# Patient Record
Sex: Male | Born: 1955
Health system: Southern US, Community
[De-identification: ages and names within clinical notes are randomized; demographics above are authoritative.]

## PROBLEM LIST (undated history)

## (undated) DIAGNOSIS — I10 Essential (primary) hypertension: Secondary | ICD-10-CM

## (undated) DIAGNOSIS — M1990 Unspecified osteoarthritis, unspecified site: Secondary | ICD-10-CM

## (undated) DIAGNOSIS — R0600 Dyspnea, unspecified: Secondary | ICD-10-CM

## (undated) DIAGNOSIS — E7800 Pure hypercholesterolemia, unspecified: Secondary | ICD-10-CM

## (undated) DIAGNOSIS — E669 Obesity, unspecified: Secondary | ICD-10-CM

## (undated) DIAGNOSIS — E119 Type 2 diabetes mellitus without complications: Secondary | ICD-10-CM

## (undated) DIAGNOSIS — Z87891 Personal history of nicotine dependence: Secondary | ICD-10-CM

## (undated) DIAGNOSIS — I4891 Unspecified atrial fibrillation: Secondary | ICD-10-CM

## (undated) DIAGNOSIS — R7303 Prediabetes: Secondary | ICD-10-CM

## (undated) HISTORY — DX: Personal history of nicotine dependence: Z87.891

## (undated) HISTORY — DX: Type 2 diabetes mellitus without complications: E11.9

## (undated) HISTORY — DX: Unspecified atrial fibrillation: I48.91

## (undated) HISTORY — PX: UMBILICAL HERNIA REPAIR: SHX196

## (undated) HISTORY — DX: Unspecified osteoarthritis, unspecified site: M19.90

## (undated) HISTORY — PX: TONSILLECTOMY: SUR1361

## (undated) HISTORY — DX: Obesity, unspecified: E66.9

---

## 2003-09-02 ENCOUNTER — Emergency Department (HOSPITAL_COMMUNITY): Admission: EM | Admit: 2003-09-02 | Discharge: 2003-09-03 | Payer: Self-pay | Admitting: Emergency Medicine

## 2003-11-04 ENCOUNTER — Emergency Department (HOSPITAL_COMMUNITY): Admission: EM | Admit: 2003-11-04 | Discharge: 2003-11-04 | Payer: Self-pay | Admitting: Emergency Medicine

## 2004-06-24 ENCOUNTER — Observation Stay (HOSPITAL_COMMUNITY): Admission: RE | Admit: 2004-06-24 | Discharge: 2004-06-25 | Payer: Self-pay | Admitting: Orthopedic Surgery

## 2004-06-24 ENCOUNTER — Encounter (INDEPENDENT_AMBULATORY_CARE_PROVIDER_SITE_OTHER): Payer: Self-pay | Admitting: Specialist

## 2004-06-24 HISTORY — PX: BACK SURGERY: SHX140

## 2008-05-06 ENCOUNTER — Emergency Department (HOSPITAL_COMMUNITY): Admission: EM | Admit: 2008-05-06 | Discharge: 2008-05-06 | Payer: Self-pay | Admitting: Emergency Medicine

## 2010-06-07 ENCOUNTER — Emergency Department (HOSPITAL_COMMUNITY)
Admission: EM | Admit: 2010-06-07 | Discharge: 2010-06-07 | Payer: Self-pay | Source: Home / Self Care | Admitting: Emergency Medicine

## 2011-01-10 NOTE — Op Note (Signed)
NAMECASEN, PRYOR                ACCOUNT NO.:  [id number]   MEDICAL RECORD NO.:  [id number]          PATIENT TYPE:  AMB   LOCATION:  DAY                          FACILITY:  [facility]   PHYSICIAN:  Marlowe Kays, M.D.  DATE OF BIRTH:  June 12, 1956   DATE OF PROCEDURE:  06/24/2004  DATE OF DISCHARGE:                                 OPERATIVE REPORT   PREOPERATIVE DIAGNOSIS:  Herniated nucleated pulposus L5-S1 left.   POSTOPERATIVE DIAGNOSIS:  Herniated nucleated pulposus L5-S1 left.   OPERATION:  Microdiskectomy, L5-S1, left.   SURGEON:  Dr. Fayrene Fearing [place]   ASSISTANT:  Dr. Worthy Rancher   ANESTHESIA:  General.   PATHOLOGY AND JUSTIFICATION FOR PROCEDURE:  Persistent back and left leg  pain central to the left disk herniation with pressure on the S1 nerve root  on MRI.   DESCRIPTION OF PROCEDURE:  Satisfactory general anesthesia, prophylactic  antibiotics, knee-chest position on the Cement frame.  Back was prepped  with DuraPrep and with three spinal needles and a lateral x-ray, I  tentatively localized the L5-S1 disk space, then continued draping the back  in a sterile field, Ioban employed, vertical midline incision based on the  initial x-ray.  I localized what I thought were the spinous processes of L5  and the sacrum, dissecting soft tissue off of both of them and then placing  a D'Errico retractor in the interlaminar space and taking a second lateral x-  ray, confirming that this retractor was headed right to the L5-S1 disk.  Accordingly, I inserted the self-retaining McCullough retractors and with a  3 mm Kerrison rongeur began removing bone from the inferior lamina of L5.  I  then undermined the sacrum with a small curette and used 2 and then later 3  mm Kerrison rongeurs to perform a wide foraminotomy removing bone,  ligamentum flavum working laterally.  When the decompression became more  difficult, brought in the microscope and completed the decompression.  His  S1  nerve root was extremely sensitive, and we had a little difficulty  mobilizing it initially.  There were a good bit of fibrovascular adhesions  lateral to it, many of which I was able to eradicate with the bipolar  cautery and 2 mm Kerrison rongeur.  After isolating the disk, I opened it  with a 15 knife blade and removed a large amount of disk material including  several large chunks centrally which is where the main portion of disk was  located with a combination of Epstein curette, nerve hook, straight, and  upbiting pituitaries.  Also looked beneath the nerve root to be sure that  there were no free fragments.  The foramen was widely patent at the  conclusion of the case, and the nerve was lying unencumbered.  There was no  unusual bleeding.  The wound was irrigated with sterile saline.  Gelfoam  soaked in thrombin was placed over the interspace, around the nerve and the  dura.  Self-retaining retractors were removed.  There was no unusual  bleeding.  Then closed the wound with interrupted #1 Vicryl in the fascia.  Subcutaneous  tissue, I infiltrated with 0.5% plain Marcaine and reapproximated with  interrupted #1 Vicryl as well.  Skin was closed with staples.  Betadine,  Adaptic dry sterile dressing were applied.  At the time of this dictation,  he was on his way to the recovery room in satisfactory condition with no  known complications.      JA/MEDQ  D:  06/24/2004  T:  06/24/2004  Job:  [id number]

## 2011-12-22 ENCOUNTER — Emergency Department (INDEPENDENT_AMBULATORY_CARE_PROVIDER_SITE_OTHER)
Admission: EM | Admit: 2011-12-22 | Discharge: 2011-12-22 | Disposition: A | Discharge status: Home / Self Care | Payer: Self-pay | Source: Home / Self Care | Attending: Family Medicine | Admitting: Family Medicine

## 2011-12-22 ENCOUNTER — Encounter (HOSPITAL_COMMUNITY): Payer: Self-pay

## 2011-12-22 DIAGNOSIS — M79605 Pain in left leg: Secondary | ICD-10-CM

## 2011-12-22 DIAGNOSIS — M545 Low back pain: Secondary | ICD-10-CM

## 2011-12-22 DIAGNOSIS — G8929 Other chronic pain: Secondary | ICD-10-CM

## 2011-12-22 HISTORY — DX: Essential (primary) hypertension: I10

## 2011-12-22 MED ORDER — NAPROXEN 500 MG PO TABS: 500.0000 mg | ORAL_TABLET | Freq: Two times a day (BID) | ORAL | Status: DC

## 2011-12-22 MED ORDER — ACETAMINOPHEN-CODEINE #3 300-30 MG PO TABS: 1.0000 | ORAL_TABLET | Freq: Four times a day (QID) | ORAL | Status: AC | PRN

## 2011-12-22 MED ORDER — PREDNISONE 20 MG PO TABS: ORAL_TABLET | ORAL | Status: AC

## 2011-12-22 NOTE — ED Notes (Signed)
C/o pain in lower back and throbbing and stinging pain in lt arm and leg.  States has chronic back pain due to work related injury for which he had back surgery in 2006.  States he has had intermittent issues with his back and pain/stinging in lt arm and leg since back injury.  States this episode has been going on for 1 week.

## 2011-12-22 NOTE — Discharge Instructions (Signed)
Take the prescribed medications as instructed. Be aware that Tylenol with codeine can cause drowsiness and he should not drive after taking this medication. Start back stretching exercises as soon as her pain improves as per handout. Go to your primary care provider or back specialist if persistent pain. Go to the emergency department if worsening of your symptoms or new symptoms like weakness in your arm, hand or leg, problems with balance, gait or vision, persistent headache etc.

## 2011-12-23 NOTE — ED Provider Notes (Signed)
History     CSN: [id number]  Arrival date & time 12/22/11  1642   First MD Initiated Contact with Patient 12/22/11 1657      Chief Complaint  Patient presents with  . Back Pain  . Arm Pain  . Leg Pain    (Consider location/radiation/quality/duration/timing/severity/associated sxs/prior treatment) HPI Comments: 56 y/o male with h/o chronic low back pain and left shoulder pain, here c/o flare up of low back and shoulder pain for 1 week. States he had an on the job injury for what he had a ruptured disk and had surgery in 2006. He also has had intermittent pain and "stinging" in left shoulder and thinks he developed joint disease related to his job, he is currently in the process of obtaining disability. Has not taken pain medications for his symptoms in last 2 days. Reports low back pain radiation to left leg similar as in prior events. Denies weakness or numbness of the left leg or left arm, but pain worse with movement. No saddle anesthesia or incontinence. No hematuria or dysuria. No headache, visual changes, gate or balance problems. No face droop of difficulty understanding or producing speech.    Past Medical History  Diagnosis Date  . Hypertension     Past Surgical History  Procedure Date  . Back surgery   . Tonsillectomy     No family history on file.  History  Substance Use Topics  . Smoking status: Current Everyday Smoker  . Smokeless tobacco: Not on file  . Alcohol Use: No      Review of Systems  Constitutional: Negative for fever and chills.  HENT: Negative for neck pain and neck stiffness.   Eyes: Negative for visual disturbance.  Cardiovascular: Negative for chest pain.  Genitourinary: Negative for dysuria, frequency, hematuria and flank pain.  Musculoskeletal: Positive for back pain and arthralgias.  Neurological: Negative for dizziness, seizures, facial asymmetry, speech difficulty, weakness, numbness and headaches.    Allergies  Review of patient's  allergies indicates no known allergies.  Home Medications   Current Outpatient Rx  Name Route Sig Dispense Refill  . ACETAMINOPHEN-CODEINE #3 300-30 MG PO TABS Oral Take 1-2 tablets by mouth every 6 (six) hours as needed for pain. 15 tablet 0  . NAPROXEN 500 MG PO TABS Oral Take 1 tablet (500 mg total) by mouth 2 (two) times daily. 20 tablet 0  . PREDNISONE 20 MG PO TABS  2 tabs po daily for 5 days 10 tablet 0    BP 122/66  Pulse 78  Temp(Src) 98.2 F (36.8 C) (Oral)  Resp 18  SpO2 98%  Physical Exam  Nursing note and vitals reviewed. Constitutional: He is oriented to person, place, and time. He appears well-developed and well-nourished. No distress.  HENT:  Head: Normocephalic and atraumatic.  Mouth/Throat: No oropharyngeal exudate.  Eyes: Conjunctivae and EOM are normal. Pupils are equal, round, and reactive to light. No scleral icterus.  Neck: Neck supple.       Negative Spurling test  Cardiovascular: Normal rate and regular rhythm.   Pulmonary/Chest: Breath sounds normal.  Musculoskeletal:       Left shoulder: no obvious deformity. No swelling or erythema. Fair range of motion although reported pain with abduction against resistance past 90 degrees. Mildly positive empty can test. Intact sensation. Left arm, forearm and hand impress neurovascularly intact.  Spine central: limited flexion and extension due to pain. Negative straight leg test. Left leg with intact sensation. DTRs symetric compared with right side.  Left lower extremity appears neurovascularly intact.  Neurological: He is alert and oriented to person, place, and time. He has normal reflexes. He displays no atrophy. No cranial nerve deficit or sensory deficit. He exhibits normal muscle tone. He displays a negative Romberg sign. Coordination normal.       No foot dragging, no arm drop. visual fields appear normal compared with mine.  Skin: No rash noted.    ED Course  Procedures (including critical care  time)  Labs Reviewed - No data to display No results found.   1. Low back pain radiating to left leg   2. Chronic shoulder pain, left       MDM  Treated with prednisone, tylenol #3 and naprosyn. Back exercises hand out provided asked to follow up with his spine specialist if persistent or worsening symptoms.         Sharin Grave, MD 12/23/11 1023

## 2012-07-21 ENCOUNTER — Encounter (HOSPITAL_COMMUNITY): Payer: Self-pay | Admitting: Emergency Medicine

## 2012-07-21 ENCOUNTER — Emergency Department (INDEPENDENT_AMBULATORY_CARE_PROVIDER_SITE_OTHER)
Admission: EM | Admit: 2012-07-21 | Discharge: 2012-07-21 | Disposition: A | Discharge status: Home / Self Care | Payer: Self-pay | Source: Home / Self Care | Attending: Emergency Medicine | Admitting: Emergency Medicine

## 2012-07-21 DIAGNOSIS — G8929 Other chronic pain: Secondary | ICD-10-CM

## 2012-07-21 DIAGNOSIS — M549 Dorsalgia, unspecified: Secondary | ICD-10-CM

## 2012-07-21 MED ORDER — MELOXICAM 7.5 MG PO TABS: 7.5000 mg | ORAL_TABLET | Freq: Every day | ORAL | Status: DC

## 2012-07-21 MED ORDER — CYCLOBENZAPRINE HCL 10 MG PO TABS: 10.0000 mg | ORAL_TABLET | Freq: Three times a day (TID) | ORAL | Status: DC | PRN

## 2012-07-21 NOTE — ED Notes (Signed)
Reports back pain since halloween 2006.  Patient feels if he stands up for a long period of times, his back hurts and he has numbness in right leg.  Reports lower back pain.  Tried OTC medication but no relief.  Patient feels if weather gets bad that's when he feels the pain the most

## 2012-07-21 NOTE — ED Provider Notes (Signed)
History     CSN: [id number]  Arrival date & time 07/21/12  1500   First MD Initiated Contact with Patient 07/21/12 1542      Chief Complaint  Patient presents with  . Back Pain    (Consider location/radiation/quality/duration/timing/severity/associated sxs/prior treatment) Patient is a 56 y.o. male presenting with back pain. The history is provided by the patient.  Back Pain  This is a recurrent problem. The current episode started more than 1 week ago. The problem occurs constantly. The problem has been gradually worsening. The pain is associated with falling and twisting. The quality of the pain is described as stabbing and aching. The pain is at a severity of 8/10. The pain is moderate. The symptoms are aggravated by certain positions and bending. Pertinent negatives include no fever, no numbness, no weight loss, no headaches, no abdominal pain, no abdominal swelling, no bowel incontinence, no perianal numbness, no bladder incontinence, no dysuria, no pelvic pain, no paresthesias, no paresis, no tingling and no weakness. He has tried nothing for the symptoms. The treatment provided no relief.    Past Medical History  Diagnosis Date  . Hypertension     Past Surgical History  Procedure Date  . Back surgery   . Tonsillectomy     History reviewed. No pertinent family history.  History  Substance Use Topics  . Smoking status: Current Every Day Smoker  . Smokeless tobacco: Not on file  . Alcohol Use: No      Review of Systems  Constitutional: Negative for fever, chills, weight loss, activity change and appetite change.  Gastrointestinal: Negative for abdominal pain and bowel incontinence.  Genitourinary: Negative for bladder incontinence, dysuria and pelvic pain.  Musculoskeletal: Positive for back pain. Negative for myalgias, joint swelling and arthralgias.  Neurological: Negative for dizziness, tingling, weakness, numbness, headaches and paresthesias.    Allergies    Review of patient's allergies indicates no known allergies.  Home Medications   Current Outpatient Rx  Name  Route  Sig  Dispense  Refill  . CYCLOBENZAPRINE HCL 10 MG PO TABS   Oral   Take 1 tablet (10 mg total) by mouth 3 (three) times daily as needed for muscle spasms.   20 tablet   0   . MELOXICAM 7.5 MG PO TABS   Oral   Take 1 tablet (7.5 mg total) by mouth daily. Take one tablet daily for 2 weeks   14 tablet   0     BP 104/65  Pulse 71  Temp 98.3 F (36.8 C) (Oral)  Resp 17  SpO2 98%  Physical Exam  Nursing note and vitals reviewed. Constitutional: He appears well-developed and well-nourished.  Eyes: Conjunctivae normal are normal.  Neck: Neck supple. No JVD present.  Cardiovascular:  No murmur heard. Musculoskeletal: He exhibits tenderness.       Lumbar back: He exhibits tenderness, bony tenderness and pain. He exhibits normal range of motion, no swelling, no edema and no spasm.       Back:  Neurological: He is alert.  Skin: No rash noted. No erythema.    ED Course  Procedures (including critical care time)  Labs Reviewed - No data to display No results found.   1. Chronic back pain       MDM   Exacerbation of chronic back pain. Patient refused a Toradol IM injection. Will send home with meloxicam and a muscle relaxer. Have advised patient to followup with orthopedic doctor pain was to persist beyond 2 weeks.  Jimmie Molly, MD 07/21/12 [id number]

## 2012-11-16 ENCOUNTER — Emergency Department (HOSPITAL_COMMUNITY)
Admission: EM | Admit: 2012-11-16 | Discharge: 2012-11-16 | Disposition: A | Discharge status: Home / Self Care | Payer: Self-pay | Source: Home / Self Care

## 2012-11-16 DIAGNOSIS — R358 Other polyuria: Secondary | ICD-10-CM

## 2012-11-16 DIAGNOSIS — H538 Other visual disturbances: Secondary | ICD-10-CM

## 2012-11-16 DIAGNOSIS — M545 Low back pain: Secondary | ICD-10-CM

## 2012-11-16 LAB — POCT URINALYSIS DIP (DEVICE)
Bilirubin Urine: NEGATIVE
Glucose, UA: NEGATIVE mg/dL
Hgb urine dipstick: NEGATIVE
Ketones, ur: NEGATIVE mg/dL
Leukocytes, UA: NEGATIVE
Nitrite: NEGATIVE
Protein, ur: NEGATIVE mg/dL
Specific Gravity, Urine: 1.02 (ref 1.005–1.030)
Urobilinogen, UA: 0.2 mg/dL (ref 0.0–1.0)
pH: 5.5 (ref 5.0–8.0)

## 2012-11-16 LAB — COMPREHENSIVE METABOLIC PANEL
ALT: 21 U/L (ref 0–53)
AST: 15 U/L (ref 0–37)
Albumin: 3.6 g/dL (ref 3.5–5.2)
Alkaline Phosphatase: 74 U/L (ref 39–117)
BUN: 14 mg/dL (ref 6–23)
CO2: 27 mEq/L (ref 19–32)
Calcium: 9.2 mg/dL (ref 8.4–10.5)
Chloride: 103 mEq/L (ref 96–112)
Creatinine, Ser: 0.98 mg/dL (ref 0.50–1.35)
GFR calc Af Amer: >90 mL/min (ref >90)
GFR calc non Af Amer: 89 mL/min — ABNORMAL LOW (ref >90)
Glucose, Bld: 96 mg/dL (ref 70–99)
Potassium: 4.5 mEq/L (ref 3.5–5.1)
Sodium: 138 mEq/L (ref 135–145)
Total Bilirubin: 0.3 mg/dL (ref 0.3–1.2)
Total Protein: 7 g/dL (ref 6.0–8.3)

## 2012-11-16 LAB — GLUCOSE, CAPILLARY: Glucose-Capillary: 85 mg/dL (ref 70–99)

## 2012-11-16 MED ORDER — NAPROXEN 500 MG PO TABS: ORAL_TABLET | ORAL | Status: DC

## 2012-11-16 MED ORDER — HYDROCODONE-ACETAMINOPHEN 5-325 MG PO TABS: 1.0000 | ORAL_TABLET | Freq: Three times a day (TID) | ORAL | Status: DC | PRN

## 2012-11-16 MED ORDER — CYCLOBENZAPRINE HCL 5 MG PO TABS: 5.0000 mg | ORAL_TABLET | Freq: Every evening | ORAL | Status: DC | PRN

## 2012-11-16 NOTE — ED Provider Notes (Signed)
History   CSN: [id number]  Arrival date & time 11/16/12  1658   Chief Complaint  Patient presents with  . Follow-up    HPI Pt reports that he has an exacerbation of chronic low back pain.  Pt says that he is having low back pain that radiates into the legs.  He is having pain with palpation.  He is reporting that he had a ruptured lower disc that required operation in 2006.  He says that he was given flexeril but it has been causing him to be very sleepy.  Pt says that he is urinating all the time.  He is having pain mostly at night.  No loss of bowel or bladder function.    Past Medical History  Diagnosis Date  . Hypertension     Past Surgical History  Procedure Laterality Date  . Back surgery    . Tonsillectomy      No family history on file.  History  Substance Use Topics  . Smoking status: Current Every Day Smoker  . Smokeless tobacco: Not on file  . Alcohol Use: No    Review of Systems  Genitourinary: Positive for urgency and frequency. Negative for enuresis.  Musculoskeletal: Positive for back pain.  All other systems reviewed and are negative.    Allergies  Review of patient's allergies indicates no known allergies.  Home Medications   Current Outpatient Rx  Name  Route  Sig  Dispense  Refill  . meloxicam (MOBIC) 7.5 MG tablet   Oral   Take 1 tablet (7.5 mg total) by mouth daily. Take one tablet daily for 2 weeks   14 tablet   0   . cyclobenzaprine (FLEXERIL) 10 MG tablet   Oral   Take 1 tablet (10 mg total) by mouth 3 (three) times daily as needed for muscle spasms.   20 tablet   0     BP 119/73  Pulse 60  Temp(Src) 97.8 F (36.6 C) (Oral)  Physical Exam  Nursing note and vitals reviewed. Constitutional: He is oriented to person, place, and time. He appears well-developed and well-nourished. No distress.  HENT:  Head: Normocephalic and atraumatic.  Eyes: Conjunctivae are normal. Pupils are equal, round, and reactive to light.  Neck:  Normal range of motion. Neck supple.  Cardiovascular: Normal rate and regular rhythm.   No murmur heard. Pulmonary/Chest: Effort normal and breath sounds normal.  Abdominal: Bowel sounds are normal.  Musculoskeletal: Normal range of motion.       Lumbar back: He exhibits tenderness, pain and spasm. He exhibits no bony tenderness, no swelling and no edema.  Neurological: He is alert and oriented to person, place, and time.  Skin: Skin is warm and dry. No rash noted. No erythema. No pallor.  Psychiatric: He has a normal mood and affect. His behavior is normal. Judgment and thought content normal.    ED Course  Procedures (including critical care time)  Labs Reviewed - No data to display No results found.  No diagnosis found.  MDM  IMPRESSION  Polyuria  Chronic LBP with acute exacerbation  Lumbar sprain   RECOMMENDATIONS / PLAN Rx given for cyclobenzaprine 5 mg po QHS prn spasm Hydrocodone/APAP 5 mg prn severe pain Naproxen 500 mg po every 12 hours with food prn Back injury prevention and exercises discussed and written information provided Check labs   FOLLOW UP 2 weeks for CPE  The patient was given clear instructions to go to ER or return to medical center  if symptoms don't improve, worsen or new problems develop.  The patient verbalized understanding.  The patient was told to call to get lab results if they haven't heard anything in the next week.    Results for orders placed during the hospital encounter of 11/16/12  COMPREHENSIVE METABOLIC PANEL      Result Value Range   Sodium 138  135 - 145 mEq/L   Potassium 4.5  3.5 - 5.1 mEq/L   Chloride 103  96 - 112 mEq/L   CO2 27  19 - 32 mEq/L   Glucose, Bld 96  70 - 99 mg/dL   BUN 14  6 - 23 mg/dL   Creatinine, Ser 4.54  0.50 - 1.35 mg/dL   Calcium 9.2  8.4 - 09.8 mg/dL   Total Protein 7.0  6.0 - 8.3 g/dL   Albumin 3.6  3.5 - 5.2 g/dL   AST 15  0 - 37 U/L   ALT 21  0 - 53 U/L   Alkaline Phosphatase 74  39 - 117 U/L    Total Bilirubin 0.3  0.3 - 1.2 mg/dL   GFR calc non Af Amer 89 (*) >90 mL/min   GFR calc Af Amer >90  >90 mL/min  HEMOGLOBIN A1C      Result Value Range   Hemoglobin A1C 6.0 (*) <5.7 %   Mean Plasma Glucose 126 (*) <117 mg/dL  GLUCOSE, CAPILLARY      Result Value Range   Glucose-Capillary 85  70 - 99 mg/dL  POCT URINALYSIS DIP (DEVICE)      Result Value Range   Glucose, UA NEGATIVE  NEGATIVE mg/dL   Bilirubin Urine NEGATIVE  NEGATIVE   Ketones, ur NEGATIVE  NEGATIVE mg/dL   Specific Gravity, Urine 1.020  1.005 - 1.030   Hgb urine dipstick NEGATIVE  NEGATIVE   pH 5.5  5.0 - 8.0   Protein, ur NEGATIVE  NEGATIVE mg/dL   Urobilinogen, UA 0.2  0.0 - 1.0 mg/dL   Nitrite NEGATIVE  NEGATIVE   Leukocytes, UA NEGATIVE  NEGATIVE           Tristan Proto Cyndie Mull, MD 11/17/12 [id number]

## 2012-11-16 NOTE — ED Notes (Signed)
Pt here for f/u after being seen at urgent care last Nov ember.  He c/o of back pain and urinary frequency, excessive thirst, blurred vision.  Feet swelling up and c/o numbness.

## 2012-11-17 ENCOUNTER — Telehealth (HOSPITAL_COMMUNITY): Payer: Self-pay

## 2012-11-17 DIAGNOSIS — M545 Low back pain: Secondary | ICD-10-CM | POA: Insufficient documentation

## 2012-11-17 LAB — HEMOGLOBIN A1C
Hgb A1c MFr Bld: 6 % — ABNORMAL HIGH (ref <5.7)
Mean Plasma Glucose: 126 mg/dL — ABNORMAL HIGH (ref <117)

## 2012-11-17 NOTE — Progress Notes (Signed)
Quick Note:  Please inform patient that he has prediabetes. Please mail him information on diabetes diet and physical activity. Low sugar diet recommended. Exercise 5 x per week. Return for complete physical in 1-2 months.   Rodney Langton, MD, CDE, FAAFP [facility] [place], [place]   ______

## 2012-11-17 NOTE — ED Notes (Signed)
Spoke with Justin [name] in reference to his recent labs. He understood he needs to start a low sugar diet and begin exercising and to return to office in 1-2 months for a physical

## 2012-12-13 ENCOUNTER — Emergency Department (INDEPENDENT_AMBULATORY_CARE_PROVIDER_SITE_OTHER)
Admission: EM | Admit: 2012-12-13 | Discharge: 2012-12-13 | Disposition: A | Discharge status: Home Health | Payer: No Typology Code available for payment source | Source: Home / Self Care

## 2012-12-13 ENCOUNTER — Encounter (HOSPITAL_COMMUNITY): Payer: Self-pay | Admitting: *Deleted

## 2012-12-13 DIAGNOSIS — Z09 Encounter for follow-up examination after completed treatment for conditions other than malignant neoplasm: Secondary | ICD-10-CM

## 2012-12-13 DIAGNOSIS — M545 Low back pain: Secondary | ICD-10-CM

## 2012-12-13 NOTE — ED Provider Notes (Signed)
History     CSN: [id number]  Arrival date & time 12/13/12  1018   First MD Initiated Contact with Patient 12/13/12 1058      Chief Complaint  Patient presents with  . Follow-up    (Consider location/radiation/quality/duration/timing/severity/associated sxs/prior treatment) HPI  Past Medical History  Diagnosis Date  . Hypertension     Past Surgical History  Procedure Laterality Date  . Back surgery    . Tonsillectomy      No family history on file.  History  Substance Use Topics  . Smoking status: Current Every Day Smoker  . Smokeless tobacco: Not on file  . Alcohol Use: No      Review of Systems  Allergies  Review of patient's allergies indicates no known allergies.  Home Medications   Current Outpatient Rx  Name  Route  Sig  Dispense  Refill  . cyclobenzaprine (FLEXERIL) 5 MG tablet   Oral   Take 1 tablet (5 mg total) by mouth at bedtime as needed for muscle spasms.   20 tablet   0   . HYDROcodone-acetaminophen (NORCO/VICODIN) 5-325 MG per tablet   Oral   Take 1 tablet by mouth every 8 (eight) hours as needed for pain.   20 tablet   0   . naproxen (NAPROSYN) 500 MG tablet      Take 1 po every 12 hours with food prn pain   20 tablet   0     BP 108/79  Pulse 62  Temp(Src) 98.1 F (36.7 C) (Oral)  Resp 18  SpO2 99%  Physical Exam Middle aged male in no acute distress HEENT: No pallor, no stool the mucosa Chest: Clear to auscultation bilaterally CVS: Normal S1 and S2, no murmurs Abdomen: Soft, nontender, bowel sounds present Extremities: Tender on palpation over bilateral lower lumbar  paraspinal area CNS: AAO X 3  ED Course  Procedures (including critical care time)  Labs Reviewed - No data to display No results found.   1. Follow up    Low back pain Like in the setting of lumbar strain. Patient was seen a few weeks ago and prescribed with pain medications however has not picked them up yet. I have informed him to start  taking his medications and see for response and followup in the clinic in one month. If he has worsening of his pain with or without associated weakness of his extremities or bowel/admitted abnormalities he needs to go to the ED. Counseled on smoking cessation and exercise to lose weight. Back injury prevention and exercises discussed again.  Prediabetes William A1c of 6. Patient informs having polyuria. Informs getting educational material on diet regimen and exercises to prevent diabetes on prior  visit. Reinforced again on dietary adherence and  Exercise.  Follow up in one month  MDM          Raynor Calcaterra, MD 12/13/12 1120

## 2012-12-13 NOTE — ED Notes (Signed)
Follow up with back pain.

## 2012-12-13 NOTE — ED Notes (Signed)
Patient presents for follow up; labs stated patient was pre-diabetic.

## 2013-01-27 ENCOUNTER — Ambulatory Visit: Payer: No Typology Code available for payment source | Attending: Family Medicine | Admitting: Family Medicine

## 2013-01-27 VITALS — BP 142/90 | HR 77 | Temp 97.7°F | Resp 16 | Wt 272.0 lb

## 2013-01-27 DIAGNOSIS — K089 Disorder of teeth and supporting structures, unspecified: Secondary | ICD-10-CM

## 2013-01-27 DIAGNOSIS — K0889 Other specified disorders of teeth and supporting structures: Secondary | ICD-10-CM

## 2013-01-27 MED ORDER — AMOXICILLIN 875 MG PO TABS: 875.0000 mg | ORAL_TABLET | Freq: Two times a day (BID) | ORAL | Status: DC

## 2013-01-27 NOTE — Progress Notes (Signed)
Patient here for dental abcess Needs dental referral

## 2013-01-27 NOTE — Progress Notes (Signed)
Subjective:     Patient ID: Justin [name], male   DOB: 1955-10-29, 57 y.o.   MRN: [id number]  HPI Pt here with several days of worsening dental pain, lower jaw on the left front. Has pain medication at home but needs to know who to call for dentist and wonders if he needs abx. No bony pain, fevers, or drainage.    Review of Systems per hpi     Objective:   Physical Exam  Nursing note and vitals reviewed. Constitutional: He appears well-developed and well-nourished.  HENT:  Very poor dentition, area of concern with red gums but no frank abscess  Cardiovascular: Normal rate.   Pulmonary/Chest: Effort normal.  Psychiatric: He has a normal mood and affect.       Assessment:     Pain, dental - Plan: amoxicillin (AMOXIL) 875 MG tablet       Plan:     Showed him on orange card where the number is to call Start on amox Unfortunately we do not have materials to do a dental block here so unable to do that F/u w dentist as soon as possible  Last seen 6 weeks ago and was supposed to f/u 4 weeks later but has not so will schedule that visit. rtc earlier if needed, call with questions or concerns.

## 2013-01-27 NOTE — Patient Instructions (Signed)
Dental Caries   Tooth decay (dental caries, cavities) is the most common of all oral diseases. It occurs in all ages but is more common in children and young adults.   CAUSES   Bacteria in your mouth combine with foods (particularly sugars and starches) to produce plaque. Plaque is a substance that sticks to the hard surfaces of teeth. The bacteria in the plaque produce acids that attack the enamel of teeth. Repeated acid attacks dissolve the enamel and create holes in the teeth. Root surfaces of teeth may also get these holes.   Other contributing factors include:    Frequent snacking and drinking of cavity-producing foods and liquids.   Poor oral hygiene.   Dry mouth.   Substance abuse such as methamphetamine.   Broken or poor fitting dental restorations.   Eating disorders.   Gastroesophageal reflux disease (GERD).   Certain radiation treatments to the head and neck.  SYMPTOMS   At first, dental decay appears as white, chalky areas on the enamel. In this early stage, symptoms are seldom present. As the decay progresses, pits and holes may appear on the enamel surfaces. Progression of the decay will lead to softening of the hard layers of the tooth. At this point you may experience some pain or achy feeling after sweet, hot, or cold foods or drinks are consumed. If left untreated, the decay will reach the internal structures of the tooth and produce severe pain. Extensive dental treatment, such as root canal therapy, may be needed to save the tooth at this late stage of decay development.   DIAGNOSIS   Most cavities will be detected during regular check-ups. A thorough medical and dental history will be taken by the dentist. The dentist will use instruments to check the surfaces of your teeth for any breakdown or discoloration. Some dentists have special instruments, such as lasers, that detect tooth decay. Dental X-rays may also show some cavities that are not visible to the eye (such as between the  contact areas of the teeth).  TREATMENT   Treatment involves removal of the tooth decay and replacement with a restorative material such as silver, gold, or composite (white) material. However, if the decay involves a large area of the tooth and there is little remaining healthy tooth structure, a cap (crown) will be fitted over the remaining structure. If the decay involves the center part of the tooth (pulp), root canal treatment will be needed before any type of dental restoration is placed. If the tooth is severely destroyed by the decay process, leaving the remaining tooth structures unrestorable, the tooth will need to be pulled (extracted). Some early tooth decay may be reversed by fluoride treatments and thorough brushing and flossing at home.  PREVENTION    Eat healthy foods. Restrict the amount of sugary, starchy foods and liquids you consume. Avoid frequent snacking and drinking of unhealthy foods and liquids.   Sealants can help with prevention of cavities. Sealants are composite resins applied onto the biting surfaces of teeth at risk for decay. They smooth out the pits and grooves and prevent food from being trapped in them. This is done in early childhood before tooth decay has started.   Fluoride tablets may also be prescribed to children between 6 months and 10 years of age if your drinking water is not fluoridated. The fluoride absorbed by the tooth enamel makes teeth less susceptible to decay. Thorough daily cleaning with a toothbrush and dental floss is the best way to prevent   cavities. Use of a fluoride toothpaste is highly recommended. Fluoride mouth rinses may be used in specific cases.   Topical application of fluoride by your dentist is important in children.   Regular visits with a dentist for checkups and cleanings are also important.  SEEK IMMEDIATE DENTAL CARE IF:   You have a fever.   You develop redness and swelling of your face, jaw, or neck.   You develop swelling around a  tooth.   You are unable to open your mouth or cannot swallow.   You have severe pain uncontrolled by pain medicine.  Document Released: 05/03/2002 Document Revised: 11/03/2011 Document Reviewed: 01/16/2011  [employer] Patient Information 2014 [employer].

## 2013-01-28 ENCOUNTER — Other Ambulatory Visit: Payer: Self-pay | Admitting: Family Medicine

## 2013-01-28 DIAGNOSIS — K0889 Other specified disorders of teeth and supporting structures: Secondary | ICD-10-CM

## 2013-01-28 MED ORDER — AMOXICILLIN 875 MG PO TABS: 875.0000 mg | ORAL_TABLET | Freq: Two times a day (BID) | ORAL | Status: DC

## 2013-01-28 NOTE — Progress Notes (Signed)
Received note this morning that pt was missing his amox script. Have printed another and will have it available for him to pick up here today. Please let him know.

## 2013-01-31 ENCOUNTER — Telehealth: Payer: Self-pay | Admitting: *Deleted

## 2013-01-31 NOTE — Telephone Encounter (Signed)
01/31/13 Patient not available message  Left for patient to return call.  P.Claramae Rigdon,RN

## 2013-01-31 NOTE — Progress Notes (Signed)
01/31/13 Patient picked up script on Friday. P.Ziah Leandro,RN BSN [facility]

## 2013-04-21 ENCOUNTER — Ambulatory Visit: Payer: No Typology Code available for payment source | Attending: Family Medicine | Admitting: Internal Medicine

## 2013-04-21 ENCOUNTER — Encounter: Payer: Self-pay | Admitting: Internal Medicine

## 2013-04-21 VITALS — BP 150/98 | HR 71 | Temp 98.5°F | Resp 18 | Ht 71.0 in | Wt 279.0 lb

## 2013-04-21 DIAGNOSIS — I10 Essential (primary) hypertension: Secondary | ICD-10-CM | POA: Insufficient documentation

## 2013-04-21 DIAGNOSIS — M545 Low back pain, unspecified: Secondary | ICD-10-CM | POA: Insufficient documentation

## 2013-04-21 DIAGNOSIS — M549 Dorsalgia, unspecified: Secondary | ICD-10-CM

## 2013-04-21 MED ORDER — OXYCODONE-ACETAMINOPHEN 5-325 MG PO TABS
1.0000 | ORAL_TABLET | ORAL | Status: DC | PRN
Start: 2013-04-21 — End: 2014-03-08

## 2013-04-21 MED ORDER — FUROSEMIDE 20 MG PO TABS: 10.0000 mg | ORAL_TABLET | Freq: Every day | ORAL | Status: DC

## 2013-04-21 NOTE — Patient Instructions (Signed)

## 2013-04-21 NOTE — Progress Notes (Signed)
Patient ID: Justin [name], male   DOB: November 14, 1955, 57 y.o.   MRN: [id number]  CC: Regular followup  HPI: Patient is 57 year old male who presents to clinic for regular followup. He explains he had back abscess drained several weeks ago and has completed a course of antibiotics. He reports this healed well, no new abscesses noted. He denies fevers and chills, no chest pain, no shortness of breath, no other recent sicknesses or hospitalizations. He reports compliance with medicines. He is not taking any medicines for blood pressure. He has been checking his blood pressure regularly and says that numbers are usually on the lower side.  No Known Allergies Past Medical History  Diagnosis Date  . Hypertension    Current Outpatient Prescriptions on File Prior to Visit  Medication Sig Dispense Refill  . cyclobenzaprine (FLEXERIL) 5 MG tablet Take 1 tablet (5 mg total) by mouth at bedtime as needed for muscle spasms.  20 tablet  0  . amoxicillin (AMOXIL) 875 MG tablet Take 1 tablet (875 mg total) by mouth 2 (two) times daily.  20 tablet  0  . naproxen (NAPROSYN) 500 MG tablet Take 1 po every 12 hours with food prn pain  20 tablet  0   No current facility-administered medications on file prior to visit.   Family history of diabetes and hypertension History   Social History  . Marital Status: Single    Spouse Name: N/A    Number of Children: N/A  . Years of Education: N/A   Occupational History  . Not on file.   Social History Main Topics  . Smoking status: Current Every Day Smoker  . Smokeless tobacco: Not on file  . Alcohol Use: No  . Drug Use: No  . Sexual Activity:    Other Topics Concern  . Not on file   Social History Narrative  . No narrative on file    Review of Systems  Constitutional: Negative for fever, chills, diaphoresis, activity change, appetite change and fatigue.  HENT: Negative for ear pain, nosebleeds, congestion, facial swelling, rhinorrhea, neck pain, neck  stiffness and ear discharge.   Eyes: Negative for pain, discharge, redness, itching and visual disturbance.  Respiratory: Negative for cough, choking, chest tightness, shortness of breath, wheezing and stridor.   Cardiovascular: Negative for chest pain, palpitations and leg swelling.  Gastrointestinal: Negative for abdominal distention.  Genitourinary: Negative for dysuria, urgency, frequency, hematuria, flank pain, decreased urine volume, difficulty urinating and dyspareunia.  Musculoskeletal: Negative for back pain, joint swelling, arthralgias and gait problem.  Neurological: Negative for dizziness, tremors, seizures, syncope, facial asymmetry, speech difficulty, weakness, light-headedness, numbness and headaches.  Hematological: Negative for adenopathy. Does not bruise/bleed easily.  Psychiatric/Behavioral: Negative for hallucinations, behavioral problems, confusion, dysphoric mood, decreased concentration and agitation.    Objective:   Filed Vitals:   04/21/13 1233  BP: 150/98  Pulse: 71  Temp: 98.5 F (36.9 C)  Resp: 18    Physical Exam  Constitutional: Appears well-developed and well-nourished. No distress.  CVS: RRR, S1/S2 +, no murmurs, no gallops, no carotid bruit.  Pulmonary: Effort and breath sounds normal, no stridor, rhonchi, wheezes, rales.  Abdominal: Soft. BS +,  no distension, tenderness, rebound or guarding.  Musculoskeletal: Normal range of motion. No edema and no tenderness.  Neuro: Alert. Normal reflexes, muscle tone coordination. No cranial nerve deficit. Psychiatric: Normal mood and affect. Behavior, judgment, thought content normal.   No results found for this basename: WBC, HGB, HCT, MCV, PLT   Lab  Results  Component Value Date   CREATININE 0.98 11/16/2012   BUN 14 11/16/2012   NA 138 11/16/2012   K 4.5 11/16/2012   CL 103 11/16/2012   CO2 27 11/16/2012    Lab Results  Component Value Date   HGBA1C 6.0* 11/16/2012   Lipid Panel  No results found for  this basename: chol, trig, hdl, cholhdl, vldl, ldlcalc       Assessment and plan:   Patient Active Problem List   Diagnosis Date Noted  . Low back pain - stable, controlled on current analgesia, referral to pain clinic made 11/17/2012  .  hypertension  - blood pressure above target range on today's visit but because patient is checking his blood pressure regularly and says that it's usually very well controlled we'll hold off on any antihypertensive medicines at this time. I advised patient to continue checking his blood pressure regularly and to call us back if the numbers are higher than 140/97 we can initiate medical therapy as indicated.  11/17/2012        Regular followup

## 2013-04-21 NOTE — Progress Notes (Signed)
PT HERE WITH SWELLING IN BOTH LEGS S/P CHRONIC BACK PAIN X 3 DYS. STATES THE SWELLING WORSENS WITH WALKING. DENIES SOB OR CP.BP 150/98 STATES PRESCRIBED MUSCLE RELAXER NOT WORKING

## 2013-05-11 ENCOUNTER — Encounter: Payer: Self-pay | Admitting: Physical Medicine & Rehabilitation

## 2013-05-23 ENCOUNTER — Ambulatory Visit: Payer: No Typology Code available for payment source | Attending: Internal Medicine

## 2013-05-23 VITALS — BP 128/78 | HR 74 | Temp 98.7°F | Resp 16 | Ht 71.0 in | Wt 281.0 lb

## 2013-05-23 DIAGNOSIS — Z299 Encounter for prophylactic measures, unspecified: Secondary | ICD-10-CM

## 2013-05-23 DIAGNOSIS — Z09 Encounter for follow-up examination after completed treatment for conditions other than malignant neoplasm: Secondary | ICD-10-CM | POA: Insufficient documentation

## 2013-05-23 LAB — BASIC METABOLIC PANEL
BUN: 14 mg/dL (ref 6–23)
CO2: 28 mEq/L (ref 19–32)
Calcium: 9.4 mg/dL (ref 8.4–10.5)
Chloride: 104 mEq/L (ref 96–112)
Creat: 1.02 mg/dL (ref 0.50–1.35)
Glucose, Bld: 146 mg/dL — ABNORMAL HIGH (ref 70–99)
Potassium: 4.6 mEq/L (ref 3.5–5.3)
Sodium: 139 mEq/L (ref 135–145)

## 2013-05-23 LAB — POCT GLYCOSYLATED HEMOGLOBIN (HGB A1C): Hemoglobin A1C: 6.2

## 2013-05-23 LAB — LIPID PANEL
Cholesterol: 237 mg/dL — ABNORMAL HIGH (ref 0–200)
HDL: 40 mg/dL (ref >39)
LDL Cholesterol: 140 mg/dL — ABNORMAL HIGH (ref 0–99)
Total CHOL/HDL Ratio: 5.9 Ratio
Triglycerides: 285 mg/dL — ABNORMAL HIGH (ref <150)
VLDL: 57 mg/dL — ABNORMAL HIGH (ref 0–40)

## 2013-05-23 NOTE — Progress Notes (Signed)
Pt is here today for labs only. 

## 2013-05-23 NOTE — Patient Instructions (Signed)
Pt was instructed to watch his diet. Eat right and exercise.

## 2013-05-30 ENCOUNTER — Telehealth: Payer: Self-pay | Admitting: Emergency Medicine

## 2013-05-30 NOTE — Telephone Encounter (Signed)
Message copied by Darlis Loan on Mon May 30, 2013  2:09 PM ------      Message from: Quentin Angst      Created: Mon May 30, 2013 11:14 AM       Please call to inform patient that his lipid panel shows high cholesterol level, his hemoglobin A1c also shows that he is at risk for diabetes. For now we'll attempt to control this with nutrition and exercise. Low carbohydrate diet, more protein diet, regular exercise at least 3 times a week minimum of 30 minutes each time. We will repeat this test again in 3 months, if lipid panel remains the same we'll need to start medication. ------

## 2013-05-30 NOTE — Telephone Encounter (Signed)
Left message with pt to call clinic to schedule repeat lipid panel

## 2013-05-31 ENCOUNTER — Telehealth: Payer: Self-pay | Admitting: Emergency Medicine

## 2013-05-31 NOTE — Telephone Encounter (Signed)
Pt called with lab results and scheduled appt for repeat lipid panel 08/29/13

## 2013-06-06 ENCOUNTER — Ambulatory Visit: Payer: No Typology Code available for payment source

## 2013-06-06 ENCOUNTER — Encounter: Payer: Self-pay | Admitting: Internal Medicine

## 2013-06-06 ENCOUNTER — Ambulatory Visit: Payer: No Typology Code available for payment source | Attending: Internal Medicine | Admitting: Internal Medicine

## 2013-06-06 VITALS — BP 126/73 | HR 70 | Temp 98.2°F | Resp 14 | Ht 71.0 in | Wt 278.0 lb

## 2013-06-06 DIAGNOSIS — E785 Hyperlipidemia, unspecified: Secondary | ICD-10-CM | POA: Insufficient documentation

## 2013-06-06 DIAGNOSIS — M545 Low back pain, unspecified: Secondary | ICD-10-CM | POA: Insufficient documentation

## 2013-06-06 DIAGNOSIS — I10 Essential (primary) hypertension: Secondary | ICD-10-CM | POA: Insufficient documentation

## 2013-06-06 DIAGNOSIS — E669 Obesity, unspecified: Secondary | ICD-10-CM | POA: Insufficient documentation

## 2013-06-06 MED ORDER — FUROSEMIDE 20 MG PO TABS: 10.0000 mg | ORAL_TABLET | Freq: Every day | ORAL | Status: DC

## 2013-06-06 MED ORDER — OMEGA-3-ACID ETHYL ESTERS 1 G PO CAPS: 2.0000 g | ORAL_CAPSULE | Freq: Two times a day (BID) | ORAL | Status: DC

## 2013-06-06 NOTE — Progress Notes (Signed)
CC: Follow up  HPI: 57 year old male with past medical history of low back pain who presented to clinic for followup. Patient has no current complaints. He needs refills on Lasix. No chest pain or shortness of breath. No reports of lower extremity swelling. No abdominal pain, nausea or vomiting.  No Known Allergies Past Medical History  Diagnosis Date  . Hypertension    Current Outpatient Prescriptions on File Prior to Visit  Medication Sig Dispense Refill  . oxyCODONE-acetaminophen (PERCOCET/ROXICET) 5-325 MG per tablet Take 1 tablet by mouth every 4 (four) hours as needed for pain.  65 tablet  0  . cyclobenzaprine (FLEXERIL) 5 MG tablet Take 1 tablet (5 mg total) by mouth at bedtime as needed for muscle spasms.  20 tablet  0  . naproxen (NAPROSYN) 500 MG tablet Take 1 po every 12 hours with food prn pain  20 tablet  0   No current facility-administered medications on file prior to visit.   Family medical history significant for HTN, HLD  History   Social History  . Marital Status: Single    Spouse Name: N/A    Number of Children: N/A  . Years of Education: N/A   Occupational History  . Not on file.   Social History Main Topics  . Smoking status: Current Every Day Smoker  . Smokeless tobacco: Not on file  . Alcohol Use: No  . Drug Use: No  . Sexual Activity:    Other Topics Concern  . Not on file   Social History Narrative  . No narrative on file    Review of Systems  Constitutional: Negative for fever, chills, diaphoresis, activity change, appetite change and fatigue.  HENT: Negative for ear pain, nosebleeds, congestion, facial swelling, rhinorrhea, neck pain, neck stiffness and ear discharge.   Eyes: Negative for pain, discharge, redness, itching and visual disturbance.  Respiratory: Negative for cough, choking, chest tightness, shortness of breath, wheezing and stridor.   Cardiovascular: Negative for chest pain, palpitations and leg swelling.   Gastrointestinal: Negative for abdominal distention.  Genitourinary: Negative for dysuria, urgency, frequency, hematuria, flank pain, decreased urine volume, difficulty urinating and dyspareunia.  Musculoskeletal: Negative for back pain, joint swelling, arthralgias and gait problem.  Neurological: Negative for dizziness, tremors, seizures, syncope, facial asymmetry, speech difficulty, weakness, light-headedness, numbness and headaches.  Hematological: Negative for adenopathy. Does not bruise/bleed easily.  Psychiatric/Behavioral: Negative for hallucinations, behavioral problems, confusion, dysphoric mood, decreased concentration and agitation.    Objective:   Filed Vitals:   06/06/13 1129  BP: 126/73  Pulse: 70  Temp: 98.2 F (36.8 C)  Resp: 14    Physical Exam  Constitutional: Appears well-developed and well-nourished. No distress.  HENT: Normocephalic. External right and left ear normal. Oropharynx is clear and moist.  Eyes: Conjunctivae and EOM are normal. PERRLA, no scleral icterus.  Neck: Normal ROM. Neck supple. No JVD. No tracheal deviation. No thyromegaly.  CVS: RRR, S1/S2 +, no murmurs, no gallops, no carotid bruit.  Pulmonary: Effort and breath sounds normal, no stridor, rhonchi, wheezes, rales.  Abdominal: Soft. BS +,  no distension, tenderness, rebound or guarding.  Musculoskeletal: Normal range of motion. No edema and no tenderness.  Lymphadenopathy: No lymphadenopathy noted, cervical, inguinal. Neuro: Alert. Normal reflexes, muscle tone coordination. No cranial nerve deficit. Skin: Skin is warm and dry. No rash noted. Not diaphoretic. No erythema. No pallor.  Psychiatric: Normal mood and affect. Behavior, judgment, thought content normal.   No results found for this basename: WBC, HGB, HCT,  MCV, PLT   Lab Results  Component Value Date   CREATININE 1.02 05/23/2013   BUN 14 05/23/2013   NA 139 05/23/2013   K 4.6 05/23/2013   CL 104 05/23/2013   CO2 28 05/23/2013     Lab Results  Component Value Date   HGBA1C 6.2 05/23/2013   Lipid Panel     Component Value Date/Time   CHOL 237* 05/23/2013 0907   TRIG 285* 05/23/2013 0907   HDL 40 05/23/2013 0907   CHOLHDL 5.9 05/23/2013 0907   VLDL 57* 05/23/2013 0907   LDLCALC 140* 05/23/2013 0907       Assessment and plan:   Patient Active Problem List   Diagnosis Date Noted  . Dyslipidemia 06/06/2013    Priority: Medium - Prescription provided for omega-3. We've talked about the nutrition regimen and how to decrease cholesterol by dietary and lifestyle modifications   . Low back pain 11/17/2012    Priority: Medium - Continue current pain medications. Referral provided to pain management clinic

## 2013-06-06 NOTE — Progress Notes (Signed)
Pt is here for a F/U visit. Pt has pain in his lower back and legs and the pain is radiating down his arms. Pt reports that for 4 days he has had numbness and tingling in his arms and hands.

## 2013-06-06 NOTE — Patient Instructions (Signed)

## 2013-08-29 ENCOUNTER — Ambulatory Visit: Payer: Medicare Other | Attending: Internal Medicine

## 2013-08-29 DIAGNOSIS — Z0000 Encounter for general adult medical examination without abnormal findings: Secondary | ICD-10-CM

## 2013-08-29 LAB — LIPID PANEL
Cholesterol: 201 mg/dL — ABNORMAL HIGH (ref 0–200)
HDL: 36 mg/dL — ABNORMAL LOW (ref >39)
LDL Cholesterol: 119 mg/dL — ABNORMAL HIGH (ref 0–99)
Total CHOL/HDL Ratio: 5.6 Ratio
Triglycerides: 230 mg/dL — ABNORMAL HIGH (ref <150)
VLDL: 46 mg/dL — ABNORMAL HIGH (ref 0–40)

## 2013-08-30 ENCOUNTER — Ambulatory Visit: Payer: Self-pay

## 2013-09-02 ENCOUNTER — Encounter: Payer: Self-pay | Admitting: Internal Medicine

## 2013-09-02 ENCOUNTER — Ambulatory Visit: Payer: Medicare Other | Attending: Internal Medicine | Admitting: Internal Medicine

## 2013-09-02 VITALS — BP 149/68 | HR 83 | Temp 98.7°F | Resp 14 | Ht 71.0 in | Wt 294.4 lb

## 2013-09-02 DIAGNOSIS — E119 Type 2 diabetes mellitus without complications: Secondary | ICD-10-CM

## 2013-09-02 DIAGNOSIS — M549 Dorsalgia, unspecified: Secondary | ICD-10-CM | POA: Diagnosis not present

## 2013-09-02 DIAGNOSIS — E785 Hyperlipidemia, unspecified: Secondary | ICD-10-CM | POA: Diagnosis not present

## 2013-09-02 LAB — GLUCOSE, POCT (MANUAL RESULT ENTRY): POC Glucose: 141 mg/dl — AB (ref 70–99)

## 2013-09-02 LAB — POCT GLYCOSYLATED HEMOGLOBIN (HGB A1C): Hemoglobin A1C: 6

## 2013-09-02 MED ORDER — NAPROXEN 500 MG PO TABS: ORAL_TABLET | ORAL | Status: DC

## 2013-09-02 MED ORDER — FUROSEMIDE 20 MG PO TABS: 10.0000 mg | ORAL_TABLET | Freq: Every day | ORAL | Status: DC

## 2013-09-02 MED ORDER — GABAPENTIN 100 MG PO CAPS: 200.0000 mg | ORAL_CAPSULE | Freq: Three times a day (TID) | ORAL | Status: DC

## 2013-09-02 MED ORDER — OMEGA-3-ACID ETHYL ESTERS 1 G PO CAPS: 2.0000 g | ORAL_CAPSULE | Freq: Two times a day (BID) | ORAL | Status: DC

## 2013-09-02 NOTE — Progress Notes (Signed)
Patient ID: Justin [name], male   DOB: 07-Sep-1955, 58 y.o.   MRN: [id number]   CC:  HPI:  58 year old male presents for a follow of his back pain. He has been taking Flexeril and Percocet for the pain. He states that the Flexeril makes him sleepy. He complains of numbness going down his right leg. He denies any stool or urinary incontinence. He has an appointment with Dr. Tessa Lerner at the pain clinic next week   Denies any chest pain any shortness of breath A1c today 6.0   No Known Allergies Past Medical History  Diagnosis Date  . Hypertension    Current Outpatient Prescriptions on File Prior to Visit  Medication Sig Dispense Refill  . oxyCODONE-acetaminophen (PERCOCET/ROXICET) 5-325 MG per tablet Take 1 tablet by mouth every 4 (four) hours as needed for pain.  65 tablet  0   No current facility-administered medications on file prior to visit.   No family history on file. History   Social History  . Marital Status: Single    Spouse Name: N/A    Number of Children: N/A  . Years of Education: N/A   Occupational History  . Not on file.   Social History Main Topics  . Smoking status: Current Every Day Smoker  . Smokeless tobacco: Not on file  . Alcohol Use: No  . Drug Use: No  . Sexual Activity:    Other Topics Concern  . Not on file   Social History Narrative  . No narrative on file    Review of Systems  Constitutional: As in history of present illness HENT: Negative for ear pain, nosebleeds, congestion, facial swelling, rhinorrhea, neck pain, neck stiffness and ear discharge.   Eyes: Negative for pain, discharge, redness, itching and visual disturbance.  Respiratory: Negative for cough, choking, chest tightness, shortness of breath, wheezing and stridor.   Cardiovascular: Negative for chest pain, palpitations and leg swelling.  Gastrointestinal: Negative for abdominal distention.  Genitourinary: Negative for dysuria, urgency, frequency, hematuria, flank pain,  decreased urine volume, difficulty urinating and dyspareunia.  Musculoskeletal: As in history of present illness  Neurological: Negative for dizziness, tremors, seizures, syncope, facial asymmetry, speech difficulty, weakness, light-headedness, numbness and headaches.  Hematological: Negative for adenopathy. Does not bruise/bleed easily.  Psychiatric/Behavioral: Negative for hallucinations, behavioral problems, confusion, dysphoric mood, decreased concentration and agitation.    Objective:   Filed Vitals:   09/02/13 0946  BP: 149/68  Pulse: 83  Temp: 98.7 F (37.1 C)  Resp: 14    Physical Exam  Constitutional: Appears well-developed and well-nourished. No distress.  HENT: Normocephalic. External right and left ear normal. Oropharynx is clear and moist.  Eyes: Conjunctivae and EOM are normal. PERRLA, no scleral icterus.  Neck: Normal ROM. Neck supple. No JVD. No tracheal deviation. No thyromegaly.  CVS: RRR, S1/S2 +, no murmurs, no gallops, no carotid bruit.  Pulmonary: Effort and breath sounds normal, no stridor, rhonchi, wheezes, rales.  Abdominal: Soft. BS +,  no distension, tenderness, rebound or guarding.  Musculoskeletal: Normal range of motion. No edema and no tenderness.  Lymphadenopathy: No lymphadenopathy noted, cervical, inguinal. Neuro: Alert. Normal reflexes, muscle tone coordination. No cranial nerve deficit. Skin: Skin is warm and dry. No rash noted. Not diaphoretic. No erythema. No pallor.  Psychiatric: Normal mood and affect. Behavior, judgment, thought content normal.   No results found for this basename: WBC, HGB, HCT, MCV, PLT   Lab Results  Component Value Date   CREATININE 1.02 05/23/2013   BUN 14  05/23/2013   NA 139 05/23/2013   K 4.6 05/23/2013   CL 104 05/23/2013   CO2 28 05/23/2013    Lab Results  Component Value Date   HGBA1C 6.0 09/02/2013   Lipid Panel     Component Value Date/Time   CHOL 201* 08/29/2013 1040   TRIG 230* 08/29/2013 1040   HDL 36*  08/29/2013 1040   CHOLHDL 5.6 08/29/2013 1040   VLDL 46* 08/29/2013 1040   LDLCALC 119* 08/29/2013 1040       Assessment and plan:   Patient Active Problem List   Diagnosis Date Noted  . Dyslipidemia 06/06/2013  . Obesity (BMI 30-39.9) 06/06/2013  . Low back pain 11/17/2012  . Lumbago 11/17/2012   Hyperlipidemia Continue Lovaza, lipid panel on the next appointment   Back pain which is chronic for 6 years He will see Dr. Tessa Lerner next week Discontinue Flexeril Started on gabapentin 200 mg 3 times a day Refill Naprosyn No refills for Percocet given, his last refill here was 04/21/13 for 65 tablets        The patient was given clear instructions to go to ER or return to medical center if symptoms don't improve, worsen or new problems develop. The patient verbalized understanding. The patient was told to call to get any lab results if not heard anything in the next week.

## 2013-09-02 NOTE — Progress Notes (Signed)
Pt is here for a f/u. Requests results for labs and medication refills.

## 2013-10-31 ENCOUNTER — Ambulatory Visit: Payer: Medicare Other | Admitting: Internal Medicine

## 2013-11-23 ENCOUNTER — Ambulatory Visit: Payer: Medicare Other | Admitting: Physical Medicine & Rehabilitation

## 2013-11-23 ENCOUNTER — Ambulatory Visit: Payer: No Typology Code available for payment source | Admitting: Physical Medicine & Rehabilitation

## 2013-11-29 ENCOUNTER — Ambulatory Visit: Payer: Medicare Other | Admitting: Internal Medicine

## 2014-01-10 ENCOUNTER — Encounter: Payer: Medicare Other | Admitting: Physical Medicine & Rehabilitation

## 2014-02-28 ENCOUNTER — Ambulatory Visit: Payer: Medicare Other | Admitting: Internal Medicine

## 2014-03-08 ENCOUNTER — Encounter: Payer: Self-pay | Admitting: Physical Medicine & Rehabilitation

## 2014-03-08 ENCOUNTER — Encounter: Payer: Medicare Other | Attending: Physical Medicine & Rehabilitation | Admitting: Physical Medicine & Rehabilitation

## 2014-03-08 VITALS — BP 108/65 | HR 71 | Resp 14 | Ht 71.0 in | Wt 260.0 lb

## 2014-03-08 DIAGNOSIS — Z981 Arthrodesis status: Secondary | ICD-10-CM | POA: Diagnosis not present

## 2014-03-08 DIAGNOSIS — M47817 Spondylosis without myelopathy or radiculopathy, lumbosacral region: Secondary | ICD-10-CM | POA: Diagnosis not present

## 2014-03-08 DIAGNOSIS — M961 Postlaminectomy syndrome, not elsewhere classified: Secondary | ICD-10-CM

## 2014-03-08 DIAGNOSIS — Z79899 Other long term (current) drug therapy: Secondary | ICD-10-CM | POA: Diagnosis not present

## 2014-03-08 DIAGNOSIS — M549 Dorsalgia, unspecified: Secondary | ICD-10-CM | POA: Diagnosis not present

## 2014-03-08 DIAGNOSIS — M545 Low back pain, unspecified: Secondary | ICD-10-CM | POA: Insufficient documentation

## 2014-03-08 DIAGNOSIS — IMO0002 Reserved for concepts with insufficient information to code with codable children: Secondary | ICD-10-CM | POA: Insufficient documentation

## 2014-03-08 DIAGNOSIS — Z5181 Encounter for therapeutic drug level monitoring: Secondary | ICD-10-CM | POA: Diagnosis not present

## 2014-03-08 DIAGNOSIS — I10 Essential (primary) hypertension: Secondary | ICD-10-CM | POA: Diagnosis not present

## 2014-03-08 DIAGNOSIS — G8929 Other chronic pain: Secondary | ICD-10-CM | POA: Diagnosis not present

## 2014-03-08 MED ORDER — METHOCARBAMOL 500 MG PO TABS
500.0000 mg | ORAL_TABLET | Freq: Four times a day (QID) | ORAL | Status: DC | PRN
Start: 2014-03-08 — End: 2016-12-26

## 2014-03-08 MED ORDER — GABAPENTIN 100 MG PO CAPS: 100.0000 mg | ORAL_CAPSULE | Freq: Three times a day (TID) | ORAL | Status: DC

## 2014-03-08 MED ORDER — NAPROXEN 500 MG PO TABS: ORAL_TABLET | ORAL | Status: DC

## 2014-03-08 NOTE — Patient Instructions (Signed)
PLEASE CALL ME WITH ANY PROBLEMS OR QUESTIONS (#[phone]).      

## 2014-03-08 NOTE — Progress Notes (Signed)
Subjective:    Patient ID: Justin [name], male    DOB: [birth year], 58 y.o.   MRN: [id number]  HPI  This is an initial visit for Justin [name] who complains of low back pain for over a year now. He has a history of lumbar diskectomy and fusion at? L5-S1 by Dr. Collier Salina apparently.  He states the pain might have come on after he moved last year. He typically gets low back pain if he stands or walks more than 5 or 10 minutes. Along with the pain his right leg goes numb. The left leg becomes numb on occasion. Sometimes his legs feel that they are about to give away when the symptoms come on. He has fallen on occasion. He usually uses a walker or cane or holds on to something if he feels this sensation.  Sometimes he has pain in his hands as well. He denies numbness in his hands. He reports that he has difficulties making a fist.   He's on nothing for pain at this point, except for an occasional naproxen. He may taken narcotics in the past, but he's not sure. He does admit to smoking marijuana recently. He does not drink    Pain Inventory Average Pain 5 Pain Right Now 5 My pain is burning and tingling  In the last 24 hours, has pain interfered with the following? General activity 7 Relation with others 6 Enjoyment of life 5 What TIME of day is your pain at its worst? evening Sleep (in general) Poor  Pain is worse with: walking, sitting and standing Pain improves with: medication Relief from Meds: 5  Mobility walk without assistance ability to climb steps?  yes do you drive?  yes  Function not employed: date last employed . Do you have any goals in this area?  no  Neuro/Psych bladder control problems weakness numbness tingling depression  Prior Studies x-rays  Physicians involved in your care Primary care .   History reviewed. No pertinent family history. History   Social History  . Marital Status: Single    Spouse Name: N/A    Number of Children: N/A  . Years  of Education: N/A   Social History Main Topics  . Smoking status: Current Every Day Smoker  . Smokeless tobacco: None  . Alcohol Use: No  . Drug Use: No  . Sexual Activity:    Other Topics Concern  . None   Social History Narrative  . None   Past Surgical History  Procedure Laterality Date  . Back surgery    . Tonsillectomy     Past Medical History  Diagnosis Date  . Hypertension    BP 108/65  Pulse 71  Resp 14  Ht 5\' 11"  (1.803 m)  Wt 260 lb (117.935 kg)  BMI 36.28 kg/m2  SpO2 99%  Opioid Risk Score: 11 Fall Risk Score: Low Fall Risk (0-5 points) (patient educated handout gien)   Review of Systems  Genitourinary: Positive for frequency.  Musculoskeletal: Positive for back pain.  Neurological: Positive for weakness and numbness.  Psychiatric/Behavioral: Positive for dysphoric mood.  All other systems reviewed and are negative.      Objective:   Physical Exam  General: Alert and oriented x 3, No apparent distress. Obese, big frame HEENT: Head is normocephalic, atraumatic, PERRLA, EOMI, sclera anicteric, oral mucosa pink and moist, dentition intact, ext ear canals clear,  Neck: Supple without JVD or lymphadenopathy Heart: Reg rate and rhythm. No murmurs rubs or gallops Chest: CTA  bilaterally without wheezes, rales, or rhonchi; no distress Abdomen: Soft, non-tender, non-distended, bowel sounds positive. Extremities: No clubbing, cyanosis, or edema. Pulses are 2+ Skin: Clean and intact without signs of breakdown Neuro: Pt is cognitively appropriate with normal insight, memory, and awareness. Cranial nerves 2-12 are intact. Sensory exam is normal. Reflexes are 2+ in all 4's. Fine motor coordination is intact. No tremors. Motor function is grossly 5/5 in all 4's. .  Musculoskeletal: standing posture normal. Normal spine alignment and pelvic alignment. able to bend at waist to about 50-60 degrees. Lumbar extension to about 15 deg, lateral rotation and bending about  20 deg and 25 degrees respectively. Flexion seemed to elicit the most pain. Facet maneuvers were equivocal. FABER caused some thigh pain bilaterally. Compression test negative, femoral nerve stretch negative. Mild pain with lying prone vs supine. SLR testing increased pain in thigh and leg right more than left. Gait was normal sitting posture was appropriate Psych: Pt's affect is appropriate. Pt is cooperative         Assessment & Plan:  1. Chronic back pain with radiculopathy 2. Post-laminectomy syndrome 3. ?borderline diabetic   Plan: 1. Resume gabapentin 100mg  TID 2. Begin trial of robaxin for muscle spasms 3. Needs xrays of his low back to assess his surgical site/disc spaces 4. Can resume naproxen 500mg  for now as well as he has reasonable relief with the alleve 5. Consider trial of physical therapy pending imagine results.  6. Follow up here in one month. 30 minutes of face to face patient care time were spent during this visit. All questions were encouraged and answered. A UDS was collected today as well.

## 2014-03-24 ENCOUNTER — Telehealth: Payer: Self-pay | Admitting: Physical Medicine & Rehabilitation

## 2014-03-24 NOTE — Telephone Encounter (Signed)
Put in appointment notes to repeat UDS.

## 2014-03-24 NOTE — Telephone Encounter (Signed)
Repeat UDS at follow up visit.  I was aware that he was using marijuana prior to this last UDS

## 2014-04-07 ENCOUNTER — Encounter: Payer: Medicare Other | Attending: Registered Nurse | Admitting: Registered Nurse

## 2014-04-07 DIAGNOSIS — M961 Postlaminectomy syndrome, not elsewhere classified: Secondary | ICD-10-CM | POA: Insufficient documentation

## 2014-04-07 DIAGNOSIS — I10 Essential (primary) hypertension: Secondary | ICD-10-CM | POA: Insufficient documentation

## 2014-04-07 DIAGNOSIS — Z981 Arthrodesis status: Secondary | ICD-10-CM | POA: Insufficient documentation

## 2014-04-07 DIAGNOSIS — M545 Low back pain, unspecified: Secondary | ICD-10-CM | POA: Insufficient documentation

## 2014-04-07 DIAGNOSIS — IMO0002 Reserved for concepts with insufficient information to code with codable children: Secondary | ICD-10-CM | POA: Insufficient documentation

## 2014-04-07 DIAGNOSIS — Z79899 Other long term (current) drug therapy: Secondary | ICD-10-CM | POA: Insufficient documentation

## 2014-06-08 ENCOUNTER — Encounter: Payer: Medicare Other | Attending: Registered Nurse | Admitting: Registered Nurse

## 2014-06-08 ENCOUNTER — Encounter: Payer: Self-pay | Admitting: Registered Nurse

## 2014-06-08 VITALS — BP 126/60 | HR 70 | Resp 14 | Ht 74.0 in | Wt 237.0 lb

## 2014-06-08 DIAGNOSIS — M47817 Spondylosis without myelopathy or radiculopathy, lumbosacral region: Secondary | ICD-10-CM | POA: Diagnosis not present

## 2014-06-08 DIAGNOSIS — M961 Postlaminectomy syndrome, not elsewhere classified: Secondary | ICD-10-CM

## 2014-06-08 DIAGNOSIS — Z79899 Other long term (current) drug therapy: Secondary | ICD-10-CM | POA: Insufficient documentation

## 2014-06-08 DIAGNOSIS — Z5181 Encounter for therapeutic drug level monitoring: Secondary | ICD-10-CM | POA: Diagnosis not present

## 2014-06-08 DIAGNOSIS — G894 Chronic pain syndrome: Secondary | ICD-10-CM

## 2014-06-08 NOTE — Progress Notes (Signed)
Subjective:    Patient ID: Justin [name], male    DOB: 1955/10/13, 58 y.o.   MRN: [id number]  HPI: Justin [name] is a 58 year old male who returns for follow up appointment. His initial appointment was July 2015 with Dr. Naaman Plummer. X-rays were ordered, he will be going today.  History of lumbar diskectomy and fusion at? L5-S1 by Dr. Collier Salina. Awaiting  X-ray results. He states" he only has pain when he sits and stands for long period of time".He has numbness, tingling and burning sensation. He had a co-pay for his gabapentin and due to his financial hardship was unable to purchase. He spoke to his caseworker and they are trying to help him. He says it can take up to 45 days, he will call the office when he has insurance approval. At that time we will re-submit order for gabapentin, he verbalizes understanding. He was in [place] for two months due to death in family and taking care of business.   Pain Inventory Average Pain 6 Pain Right Now 5 My pain is intermittent, dull and aching  In the last 24 hours, has pain interfered with the following? General activity 3 Relation with others 3 Enjoyment of life 3 What TIME of day is your pain at its worst? evening Sleep (in general) Fair  Pain is worse with: standing and some activites Pain improves with: rest and medication Relief from Meds: 3  Mobility walk without assistance ability to climb steps?  yes do you drive?  yes  Function disabled: date disabled .  Neuro/Psych bladder control problems spasms  Prior Studies Any changes since last visit?  yes  Physicians involved in your care Any changes since last visit?  yes   No family history on file. History   Social History  . Marital Status: Single    Spouse Name: N/A    Number of Children: N/A  . Years of Education: N/A   Social History Main Topics  . Smoking status: Current Every Day Smoker  . Smokeless tobacco: None  . Alcohol Use: No  . Drug Use: No  .  Sexual Activity:    Other Topics Concern  . None   Social History Narrative  . None   Past Surgical History  Procedure Laterality Date  . Back surgery    . Tonsillectomy     Past Medical History  Diagnosis Date  . Hypertension    BP 126/60  Pulse 70  Resp 14  Ht 6\' 2"  (1.88 m)  Wt 237 lb (107.502 kg)  BMI 30.42 kg/m2  SpO2 97%  Opioid Risk Score:   Fall Risk Score: Low Fall Risk (0-5 points)   Review of Systems     Objective:   Physical Exam  Nursing note and vitals reviewed. Constitutional: He is oriented to person, place, and time. He appears well-developed and well-nourished.  HENT:  Head: Normocephalic and atraumatic.  Neck: Normal range of motion. Neck supple.  Cardiovascular: Normal rate and regular rhythm.   Pulmonary/Chest: Effort normal and breath sounds normal.  Musculoskeletal:  Normal Muscle Bulk and Muscle testing Reveals: Upper Extremities: Full ROM and Muscle Strength 5/5 Back without Spinal or Paraspinal Tenderness Lower Extremities: Full ROM and Muscle Strength 5/5 Arises from chair with ease Narrow Based gait  Neurological: He is alert and oriented to person, place, and time.  Skin: Skin is warm and dry.  Psychiatric: He has a normal mood and affect.  Assessment & Plan:  1. Chronic back pain with radiculopathy : When insurance is approved: Start Gabapentin 100mg  TID and Continue Naproxen 2. Post-laminectomy syndrome : Continue with Exercise Regime  3. Muscle Spasms: Continue Robaxin UDS Repeated  20 minutes of face to face patient care time was spent during this visit. All questions were encouraged and answered.   F/U in 3 months

## 2014-07-05 ENCOUNTER — Telehealth: Payer: Self-pay | Admitting: Physical Medicine & Rehabilitation

## 2014-07-05 NOTE — Telephone Encounter (Signed)
-----   Message from Lamar Sprinkles, RN sent at 06/21/2014  1:47 PM EDT ----- Inconsistent.  THC present  Last test in July had THC level of 106.  Today 28.  This is 3 months later.  Should be 0

## 2014-07-05 NOTE — Telephone Encounter (Signed)
He'll remain non-narcotic as he's been before

## 2014-07-06 NOTE — Telephone Encounter (Signed)
Noted and added to FYI in [place]

## 2014-07-18 ENCOUNTER — Telehealth: Payer: Self-pay | Admitting: *Deleted

## 2014-07-18 DIAGNOSIS — G8929 Other chronic pain: Secondary | ICD-10-CM

## 2014-07-18 DIAGNOSIS — M549 Dorsalgia, unspecified: Secondary | ICD-10-CM

## 2014-07-18 DIAGNOSIS — M961 Postlaminectomy syndrome, not elsewhere classified: Secondary | ICD-10-CM

## 2014-07-18 DIAGNOSIS — Z5181 Encounter for therapeutic drug level monitoring: Secondary | ICD-10-CM

## 2014-07-18 DIAGNOSIS — M47817 Spondylosis without myelopathy or radiculopathy, lumbosacral region: Secondary | ICD-10-CM

## 2014-07-18 NOTE — Telephone Encounter (Signed)
Pt called,message a bit confusing, he was unable to fill his methocarbomel Rx because he says they were not covered. He is also out of naproxen and lasix. It looks like these scripts were ordered back in July and never picked up. I tried calling pt for clarification but pt did not pick up

## 2014-07-19 MED ORDER — NAPROXEN 500 MG PO TABS: ORAL_TABLET | ORAL | Status: DC

## 2014-07-19 MED ORDER — CYCLOBENZAPRINE HCL 10 MG PO TABS: 10.0000 mg | ORAL_TABLET | Freq: Three times a day (TID) | ORAL | Status: DC | PRN

## 2014-07-19 MED ORDER — FUROSEMIDE 20 MG PO TABS: 10.0000 mg | ORAL_TABLET | Freq: Every day | ORAL | Status: DC

## 2014-07-19 NOTE — Telephone Encounter (Signed)
Rx sent electronically, left voice message with pt

## 2014-07-19 NOTE — Telephone Encounter (Signed)
Lasix and naproxen refilled. He can try flexeril 10mg  q8 prn #60 3RF in place of robaxin if robaxin isn't covered

## 2014-07-24 ENCOUNTER — Telehealth: Payer: Self-pay | Admitting: Physical Medicine & Rehabilitation

## 2014-07-24 NOTE — Telephone Encounter (Signed)
Patient called requesting a refill on his medication

## 2014-07-25 NOTE — Telephone Encounter (Signed)
I left message for Justin [name] to call our office back to discuss what medication he is requesting.

## 2014-08-04 ENCOUNTER — Telehealth: Payer: Self-pay | Admitting: *Deleted

## 2014-08-04 NOTE — Telephone Encounter (Signed)
Mr. Cambridge called, seemed confused about what medicine was being discontinued and what medication was being added. I informed the pt of Dr. Charm Barges note in which he said robaxin could be stopped and Flexeril could be called in, I informed the pt that [employer] had been sent in already and should be ready for p/u

## 2014-09-01 ENCOUNTER — Telehealth: Payer: Self-pay | Admitting: *Deleted

## 2014-09-01 NOTE — Telephone Encounter (Signed)
Got automated call reminder about appt 09/06/14.  He cannot make this appt and will be out of town for a funeral.  Please put him on list to call to reschedule appt.

## 2014-09-06 ENCOUNTER — Ambulatory Visit: Payer: Medicare Other | Admitting: Registered Nurse

## 2014-09-06 ENCOUNTER — Ambulatory Visit: Payer: Medicare Other | Admitting: Physical Medicine & Rehabilitation

## 2014-09-22 ENCOUNTER — Ambulatory Visit
Admission: RE | Admit: 2014-09-22 | Discharge: 2014-09-22 | Disposition: A | Discharge status: Home / Self Care | Payer: Medicare Other | Source: Ambulatory Visit | Attending: Physical Medicine & Rehabilitation | Admitting: Physical Medicine & Rehabilitation

## 2014-09-22 DIAGNOSIS — M47817 Spondylosis without myelopathy or radiculopathy, lumbosacral region: Secondary | ICD-10-CM

## 2014-09-22 DIAGNOSIS — Z5181 Encounter for therapeutic drug level monitoring: Secondary | ICD-10-CM

## 2014-09-22 DIAGNOSIS — G8929 Other chronic pain: Secondary | ICD-10-CM

## 2014-09-22 DIAGNOSIS — M961 Postlaminectomy syndrome, not elsewhere classified: Secondary | ICD-10-CM

## 2014-09-22 DIAGNOSIS — M549 Dorsalgia, unspecified: Principal | ICD-10-CM

## 2014-09-22 DIAGNOSIS — Z79899 Other long term (current) drug therapy: Secondary | ICD-10-CM

## 2014-09-25 ENCOUNTER — Telehealth: Payer: Self-pay | Admitting: Physical Medicine & Rehabilitation

## 2014-09-25 NOTE — Telephone Encounter (Signed)
Notified.  He has appt coming up 10/11/14

## 2014-09-25 NOTE — Telephone Encounter (Signed)
Please let patient know that there are no emergent findings on xrays. Would need to see him and discuss   follow up plan the following findings on xray:  Mild loss of disc height at L3-L4 with moderate loss disc height at L4-L5 and L5-S1.  Facet degenerative changes are noted, mild, at L4-L5 and L5-S1.

## 2014-10-11 ENCOUNTER — Encounter: Payer: Medicare Other | Attending: Registered Nurse | Admitting: Physical Medicine & Rehabilitation

## 2014-10-11 ENCOUNTER — Encounter: Payer: Self-pay | Admitting: Physical Medicine & Rehabilitation

## 2014-10-11 VITALS — BP 136/82 | HR 78 | Resp 14

## 2014-10-11 DIAGNOSIS — M549 Dorsalgia, unspecified: Secondary | ICD-10-CM | POA: Insufficient documentation

## 2014-10-11 DIAGNOSIS — M961 Postlaminectomy syndrome, not elsewhere classified: Secondary | ICD-10-CM | POA: Diagnosis not present

## 2014-10-11 DIAGNOSIS — M47817 Spondylosis without myelopathy or radiculopathy, lumbosacral region: Secondary | ICD-10-CM

## 2014-10-11 DIAGNOSIS — G8929 Other chronic pain: Secondary | ICD-10-CM | POA: Insufficient documentation

## 2014-10-11 DIAGNOSIS — Y838 Other surgical procedures as the cause of abnormal reaction of the patient, or of later complication, without mention of misadventure at the time of the procedure: Secondary | ICD-10-CM | POA: Insufficient documentation

## 2014-10-11 DIAGNOSIS — M479 Spondylosis, unspecified: Secondary | ICD-10-CM | POA: Insufficient documentation

## 2014-10-11 NOTE — Patient Instructions (Signed)

## 2014-10-11 NOTE — Progress Notes (Signed)
Subjective:    Patient ID: Justin [name], male    DOB: June 12, 1956, 59 y.o.   MRN: [id number]  HPI   Justin [name] is here in follow up of his chronic pain. He has a history of a remote diskectomy (?2006)    He had a lumbar xray performed in January. It revealed: Mild loss of disc height at L3-L4 with moderate loss disc height at L4-L5 and L5-S1.  Facet degenerative changes are noted, mild, at L4-L5 and L5-S1.  For pain he is using gabapentin daily. He is using the naproxen as needed when his back flares up---typically only 1 or 2 tabs a week at best. He [name] got his flexeril rx filled.  He feels his pain is under reasonable control--it may flare with bad weather or if he overdoes it physically.  He walks with his dog for exercise. He does minimal stretching.     Pain Inventory Average Pain 6 Pain Right Now 4 My pain is intermittent, sharp, stabbing and aching  In the last 24 hours, has pain interfered with the following? General activity 4 Relation with others 5 Enjoyment of life 5 What TIME of day is your pain at its worst? VARIES - depends on activity Sleep (in general) Fair  Pain is worse with: walking, standing and some activites Pain improves with: rest and medication Relief from Meds: 3  Mobility walk without assistance how many minutes can you walk? 5-10 ability to climb steps?  yes do you drive?  yes  Function disabled: date disabled .  Neuro/Psych weakness numbness trouble walking spasms  Prior Studies Any changes since last visit?  no  Physicians involved in your care Any changes since last visit?  no   History reviewed. No pertinent family history. History   Social History  . Marital Status: Single    Spouse Name: N/A  . Number of Children: N/A  . Years of Education: N/A   Social History Main Topics  . Smoking status: Current Every Day Smoker  . Smokeless tobacco: Not on file  . Alcohol Use: No  . Drug Use: No  . Sexual Activity:  Not on file   Other Topics Concern  . None   Social History Narrative   Past Surgical History  Procedure Laterality Date  . Back surgery    . Tonsillectomy     Past Medical History  Diagnosis Date  . Hypertension    BP 136/82 mmHg  Pulse 78  Resp 14  SpO2 98%  Opioid Risk Score:   Fall Risk Score: Low Fall Risk (0-5 points)  Review of Systems  HENT: Negative.   Eyes: Negative.   Respiratory: Negative.   Cardiovascular: Negative.   Gastrointestinal: Negative.   Endocrine: Negative.   Genitourinary: Negative.   Musculoskeletal: Positive for back pain and arthralgias.  Skin: Negative.   Allergic/Immunologic: Negative.   Neurological: Positive for weakness and numbness.       Spasms  Hematological: Negative.   Psychiatric/Behavioral: Negative.        Objective:   Physical Exam  General: Alert and oriented x 3, No apparent distress. Obese, big frame  HEENT: Head is normocephalic, atraumatic, PERRLA, EOMI, sclera anicteric, oral mucosa pink and moist, dentition intact, ext ear canals clear,  Neck: Supple without JVD or lymphadenopathy  Heart: Reg rate and rhythm. No murmurs rubs or gallops  Chest: CTA bilaterally without wheezes, rales, or rhonchi; no distress  Abdomen: Soft, non-tender, non-distended, bowel sounds positive.  Extremities: No clubbing,  cyanosis, or edema. Pulses are 2+  Skin: Clean and intact without signs of breakdown  Neuro: Pt is cognitively appropriate with normal insight, memory, and awareness. Cranial nerves 2-12 are intact. Sensory exam is normal. Reflexes are 2+ in all 4's. Fine motor coordination is intact. No tremors. Motor function is grossly 5/5 in all 4's. .  Musculoskeletal: standing posture normal. Normal spine alignment and pelvic alignment. able to bend at waist to about  60 degrees. Lumbar extension to about 15 deg, lateral rotation and bending about 20 deg  respectively. Flexion seemed to elicit the most pain. Facet maneuvers were  equivocal. FABER caused some thigh pain bilaterally. Compression test negative, femoral nerve stretch negative. Hamstrings and gluts, lumbar paraspinals are tight. SLR testing increased pain in thigh and leg right more than left. Gait was normal sitting posture was appropriate  Psych: Pt's affect is appropriate. Pt is cooperative   Assessment & Plan:   1. Chronic back pain with mild radiculopathy--has mild spondylosis on xray 2. Post-laminectomy syndrome  3. ?borderline diabetic    Plan:  1. Continue gabapentin 100mg  TID  2. Flexeril for muscle spasms  3. Lumbar spine exercises were provided. He may pursue gym exercises with caution 4.   naproxen 500mg  prn on a limited basis----SE permitting   6. Follow up here in 4 months. 30 minutes of face to face patient care time were spent during this visit. All questions were encouraged and answered. A UDS was collected today as well.

## 2015-02-12 ENCOUNTER — Encounter: Payer: Self-pay | Admitting: Internal Medicine

## 2015-02-12 ENCOUNTER — Ambulatory Visit: Payer: Medicare Other | Attending: Internal Medicine | Admitting: Internal Medicine

## 2015-02-12 ENCOUNTER — Encounter: Payer: Medicare Other | Attending: Registered Nurse | Admitting: Physical Medicine & Rehabilitation

## 2015-02-12 ENCOUNTER — Encounter: Payer: Self-pay | Admitting: Physical Medicine & Rehabilitation

## 2015-02-12 VITALS — BP 113/67 | HR 65 | Resp 14

## 2015-02-12 VITALS — BP 127/80 | HR 94 | Wt 272.0 lb

## 2015-02-12 DIAGNOSIS — Z5181 Encounter for therapeutic drug level monitoring: Secondary | ICD-10-CM

## 2015-02-12 DIAGNOSIS — M479 Spondylosis, unspecified: Secondary | ICD-10-CM | POA: Insufficient documentation

## 2015-02-12 DIAGNOSIS — M549 Dorsalgia, unspecified: Secondary | ICD-10-CM

## 2015-02-12 DIAGNOSIS — M961 Postlaminectomy syndrome, not elsewhere classified: Secondary | ICD-10-CM | POA: Insufficient documentation

## 2015-02-12 DIAGNOSIS — E785 Hyperlipidemia, unspecified: Secondary | ICD-10-CM | POA: Diagnosis not present

## 2015-02-12 DIAGNOSIS — Z125 Encounter for screening for malignant neoplasm of prostate: Secondary | ICD-10-CM | POA: Diagnosis not present

## 2015-02-12 DIAGNOSIS — Z9189 Other specified personal risk factors, not elsewhere classified: Secondary | ICD-10-CM

## 2015-02-12 DIAGNOSIS — Z79899 Other long term (current) drug therapy: Secondary | ICD-10-CM | POA: Diagnosis not present

## 2015-02-12 DIAGNOSIS — M47817 Spondylosis without myelopathy or radiculopathy, lumbosacral region: Secondary | ICD-10-CM | POA: Diagnosis not present

## 2015-02-12 DIAGNOSIS — G8929 Other chronic pain: Secondary | ICD-10-CM | POA: Diagnosis not present

## 2015-02-12 MED ORDER — NAPROXEN 500 MG PO TABS: ORAL_TABLET | ORAL | Status: DC

## 2015-02-12 MED ORDER — GABAPENTIN 100 MG PO CAPS: 200.0000 mg | ORAL_CAPSULE | Freq: Three times a day (TID) | ORAL | Status: DC

## 2015-02-12 NOTE — Progress Notes (Signed)
  New patient here to reestablish care. Pt would like to discuss his Hypertension and Diabetes. Pt requesting a refill on Furosemide.

## 2015-02-12 NOTE — Progress Notes (Signed)
Subjective:    Patient ID: Justin [name], male    DOB: 07/14/1956, 59 y.o.   MRN: [id number]  HPI  Justin [name] is here in follow up of his chronic pain. He has been doing fairly well. His pain scores have improved. He does notice that if he stands more than 20-30 minutes he'll start to experience some burning in his right leg which improves when he sits down.   He is still taking his gabapentin as I prescribed, TID.   He was given oxycodones [facility]---they were too strong initially (10mg ) but later he was given 5mg  which helped with more severe pain. He stopped the naproxen when he ran out.     Pain Inventory Average Pain 4 Pain Right Now 2 My pain is intermittent, sharp, stabbing and aching  In the last 24 hours, has pain interfered with the following? General activity 6 Relation with others 1 Enjoyment of life 4 What TIME of day is your pain at its worst? evening Sleep (in general) Fair  Pain is worse with: walking, sitting, standing and some activites Pain improves with: pacing activities and medication Relief from Meds: 2  Mobility walk with assistance use a cane do you drive?  yes  Function not employed: date last employed .  Neuro/Psych No problems in this area  Prior Studies Any changes since last visit?  no  Physicians involved in your care Any changes since last visit?  no   History reviewed. No pertinent family history. History   Social History  . Marital Status: Single    Spouse Name: N/A  . Number of Children: N/A  . Years of Education: N/A   Social History Main Topics  . Smoking status: Current Every Day Smoker  . Smokeless tobacco: Not on file  . Alcohol Use: No  . Drug Use: No  . Sexual Activity: Not on file   Other Topics Concern  . None   Social History Narrative   Past Surgical History  Procedure Laterality Date  . Back surgery    . Tonsillectomy     Past Medical History  Diagnosis Date  . Hypertension      BP 113/67 mmHg  Pulse 65  Resp 14  SpO2 96%  Opioid Risk Score:   Fall Risk Score: Low Fall Risk (0-5 points)`1  Depression screen PHQ 2/9  No flowsheet data found.   Review of Systems  All other systems reviewed and are negative.      Objective:   Physical Exam  General: Alert and oriented x 3, No apparent distress. Obese, big frame  HEENT: Head is normocephalic, atraumatic, PERRLA, EOMI, sclera anicteric, oral mucosa pink and moist, dentition intact, ext ear canals clear,  Neck: Supple without JVD or lymphadenopathy  Heart: Reg rate and rhythm. No murmurs rubs or gallops  Chest: CTA bilaterally without wheezes, rales, or rhonchi; no distress  Abdomen: Soft, non-tender, non-distended, bowel sounds positive.  Extremities: No clubbing, cyanosis, or edema. Pulses are 2+  Skin: Clean and intact without signs of breakdown  Neuro: Pt is cognitively appropriate with normal insight, memory, and awareness. Cranial nerves 2-12 are intact. Sensory exam is normal. Reflexes are 2+ in all 4's. Fine motor coordination is intact. No tremors. Motor function is grossly 5/5 in all 4's. .  Musculoskeletal: standing posture normal. Normal spine alignment and pelvic alignment. able to bend at waist to about 70 degrees. Lumbar extension to about 20 deg, lateral rotation and bending  about 20 deg respectively.  Facet maneuvers were equivocal. . Compression test negative, femoral nerve stretch negative. Hamstrings and gluts, lumbar paraspinals remain tight. SLR testing with increased pain in thigh and leg right more than left. Gait was normal sitting posture was appropriate  Psych: Pt's affect is appropriate. Pt is cooperative   Assessment & Plan:   1. Chronic back pain with mild radiculopathy--has mild spondylosis on xray  2. Post-laminectomy syndrome  3. ?borderline diabetic    Plan:  1. Increase gabapentin to 200mg  TID  2. Flexeril for muscle spasms. Discussed regular stretching for  hamstrings. Exercises were provided. 3. Discussed the fact that he should be able to avoid the need for narcotic mgt.  4. naproxen 500mg  prn on a limited basis----RF today. 6. Follow up here in 4 months. 15 minutes of face to face patient care time were spent during this visit. All questions were encouraged and answered. A UDS was collected today as well.

## 2015-02-12 NOTE — Progress Notes (Signed)
Patient ID: Justin [name], male   DOB: [birth year], 59 y.o.   MRN: [id number]  CC: f/u   HPI: Justin [name] is a 59 y.o. male here today for a follow up visit.  Patient has past medical history of chronic back pain with previous back surgeries. He has not been seen here in 15 months. He is re-establishing care today. Today he would like a refill of his lasix. Patient is unsure why he is on lasix but states that one doctor told him he needed it and has bene on it since then. He would like to have a colonoscopy ordered.   Patient has No headache, No chest pain, No abdominal pain - No Nausea, No new weakness tingling or numbness, No Cough - SOB.  No Known Allergies Past Medical History  Diagnosis Date  . Hypertension    Current Outpatient Prescriptions on File Prior to Visit  Medication Sig Dispense Refill  . cyclobenzaprine (FLEXERIL) 10 MG tablet Take 1 tablet (10 mg total) by mouth 3 (three) times daily as needed for muscle spasms. (Patient not taking: Reported on 02/12/2015) 60 tablet 3  . furosemide (LASIX) 20 MG tablet Take 0.5 tablets (10 mg total) by mouth daily. (Patient not taking: Reported on 02/12/2015) 30 tablet 6  . gabapentin (NEURONTIN) 100 MG capsule Take 2 capsules (200 mg total) by mouth 3 (three) times daily. (Patient not taking: Reported on 02/12/2015) 180 capsule 3  . methocarbamol (ROBAXIN) 500 MG tablet Take 1 tablet (500 mg total) by mouth every 6 (six) hours as needed for muscle spasms. (Patient not taking: Reported on 02/12/2015) 60 tablet 2  . naproxen (NAPROSYN) 500 MG tablet Take 1daily as needed for pain (with food) (Patient not taking: Reported on 02/12/2015) 30 tablet 1  . omega-3 acid ethyl esters (LOVAZA) 1 G capsule Take 2 capsules (2 g total) by mouth 2 (two) times daily. (Patient not taking: Reported on 02/12/2015) 60 capsule 3   No current facility-administered medications on file prior to visit.   No family history on file. History   Social History  . Marital  Status: Single    Spouse Name: N/A  . Number of Children: N/A  . Years of Education: N/A   Occupational History  . Not on file.   Social History Main Topics  . Smoking status: Current Every Day Smoker  . Smokeless tobacco: Not on file  . Alcohol Use: No  . Drug Use: No  . Sexual Activity: Not on file   Other Topics Concern  . Not on file   Social History Narrative    Review of Systems: Constitutional: Negative for fever, chills, diaphoresis, activity change, appetite change and fatigue. HENT: Negative for ear pain, nosebleeds, congestion, facial swelling, rhinorrhea, neck pain, neck stiffness and ear discharge.  Eyes: Negative for pain, discharge, redness, itching and visual disturbance. Respiratory: Negative for cough, choking, chest tightness, shortness of breath, wheezing and stridor.  Cardiovascular: Negative for chest pain, palpitations and leg swelling. Gastrointestinal: Negative for abdominal distention. Genitourinary: Negative for dysuria, urgency, frequency, hematuria, flank pain, decreased urine volume, difficulty urinating and dyspareunia.  Musculoskeletal: Negative for back pain, joint swelling, arthralgias and gait problem. Neurological: Negative for dizziness, tremors, seizures, syncope, facial asymmetry, speech difficulty, weakness, light-headedness, numbness and headaches.  Hematological: Negative for adenopathy. Does not bruise/bleed easily. Psychiatric/Behavioral: Negative for hallucinations, behavioral problems, confusion, dysphoric mood, decreased concentration and agitation.    Objective:   Filed Vitals:   02/12/15 1604  BP: 127/80  Pulse:  94    Physical Exam  Constitutional: He is oriented to person, place, and time.  Cardiovascular: Normal rate, regular rhythm and normal heart sounds.   Pulmonary/Chest: Effort normal and breath sounds normal.  Musculoskeletal: Normal range of motion. He exhibits no edema.  Neurological: He is alert and oriented  to person, place, and time.     No results found for: WBC, HGB, HCT, MCV, PLT Lab Results  Component Value Date   CREATININE 1.02 05/23/2013   BUN 14 05/23/2013   NA 139 05/23/2013   K 4.6 05/23/2013   CL 104 05/23/2013   CO2 28 05/23/2013    Lab Results  Component Value Date   HGBA1C 6.0 09/02/2013   Lipid Panel     Component Value Date/Time   CHOL 201* 08/29/2013 1040   TRIG 230* 08/29/2013 1040   HDL 36* 08/29/2013 1040   CHOLHDL 5.6 08/29/2013 1040   VLDL 46* 08/29/2013 1040   LDLCALC 119* 08/29/2013 1040       Assessment and plan:   Justin [name] was seen today for new patient.  Diagnoses and all orders for this visit:  HLD (hyperlipidemia) Orders: -     Lipid panel; Future I have encouraged patient to take Fish oil to help lower cholesterol.  Encounter for long-term (current) drug use Orders: -     Basic Metabolic Panel Will make sure patients potassium is WNL since he has been on Lasix for several months with no potassium replacement.  High risk for colon cancer Orders: -     HM COLONOSCOPY -     CBC; Future Patient's brother has colon cancer  Prostate cancer screening Orders: -     PSA, Medicare; Future   Return in about 1 week (around 02/19/2015) for Lab Visit.       Chari Manning, NP-C [facility] [phone] 02/12/2015, 4:13 PM ]

## 2015-02-12 NOTE — Patient Instructions (Signed)
WORK ON REGULAR STRETCHING

## 2015-02-12 NOTE — Patient Instructions (Signed)
Smoking Cessation Quitting smoking is important to your health and has many advantages. However, it is not always easy to quit since nicotine is a very addictive drug. Oftentimes, people try 3 times or more before being able to quit. This document explains the best ways for you to prepare to quit smoking. Quitting takes hard work and a lot of effort, but you can do it. ADVANTAGES OF QUITTING SMOKING  You will live longer, feel better, and live better.  Your body will feel the impact of quitting smoking almost immediately.  Within 20 minutes, blood pressure decreases. Your pulse returns to its normal level.  After 8 hours, carbon monoxide levels in the blood return to normal. Your oxygen level increases.  After 24 hours, the chance of having a heart attack starts to decrease. Your breath, hair, and body stop smelling like smoke.  After 48 hours, damaged nerve endings begin to recover. Your sense of taste and smell improve.  After 72 hours, the body is virtually free of nicotine. Your bronchial tubes relax and breathing becomes easier.  After 2 to 12 weeks, lungs can hold more air. Exercise becomes easier and circulation improves.  The risk of having a heart attack, stroke, cancer, or lung disease is greatly reduced.  After 1 year, the risk of coronary heart disease is cut in half.  After 5 years, the risk of stroke falls to the same as a nonsmoker.  After 10 years, the risk of lung cancer is cut in half and the risk of other cancers decreases significantly.  After 15 years, the risk of coronary heart disease drops, usually to the level of a nonsmoker.  If you are pregnant, quitting smoking will improve your chances of having a healthy baby.  The people you live with, especially any children, will be healthier.  You will have extra money to spend on things other than cigarettes. QUESTIONS TO THINK ABOUT BEFORE ATTEMPTING TO QUIT You may want to talk about your answers with your  health care provider.  Why do you want to quit?  If you tried to quit in the past, what helped and what did not?  What will be the most difficult situations for you after you quit? How will you plan to handle them?  Who can help you through the tough times? Your family? Friends? A health care provider?  What pleasures do you get from smoking? What ways can you still get pleasure if you quit? Here are some questions to ask your health care provider:  How can you help me to be successful at quitting?  What medicine do you think would be best for me and how should I take it?  What should I do if I need more help?  What is smoking withdrawal like? How can I get information on withdrawal? GET READY  Set a quit date.  Change your environment by getting rid of all cigarettes, ashtrays, matches, and lighters in your home, car, or work. Do not let people smoke in your home.  Review your past attempts to quit. Think about what worked and what did not. GET SUPPORT AND ENCOURAGEMENT You have a better chance of being successful if you have help. You can get support in many ways.  Tell your family, friends, and coworkers that you are going to quit and need their support. Ask them not to smoke around you.  Get individual, group, or telephone counseling and support. Programs are available at local hospitals and health centers. Call   your local health department for information about programs in your area.  Spiritual beliefs and practices may help some smokers quit.  Download a "quit meter" on your computer to keep track of quit statistics, such as how long you have gone without smoking, cigarettes not smoked, and money saved.  Get a self-help book about quitting smoking and staying off tobacco. [place] yourself from urges to smoke. Talk to someone, go for a walk, or occupy your time with a task.  Change your normal routine. Take a different route to work.  Drink tea instead of coffee. Eat breakfast in a different place.  Reduce your stress. Take a hot bath, exercise, or read a book.  Plan something enjoyable to do every day. Reward yourself for not smoking.  Explore interactive web-based programs that specialize in helping you quit. GET MEDICINE AND USE IT CORRECTLY Medicines can help you stop smoking and decrease the urge to smoke. Combining medicine with the above behavioral methods and support can greatly increase your chances of successfully quitting smoking.  Nicotine replacement therapy helps deliver nicotine to your body without the negative effects and risks of smoking. Nicotine replacement therapy includes nicotine gum, lozenges, inhalers, nasal sprays, and skin patches. Some may be available over-the-counter and others require a prescription.  Antidepressant medicine helps people abstain from smoking, but how this works is unknown. This medicine is available by prescription.  Nicotinic receptor partial agonist medicine simulates the effect of nicotine in your brain. This medicine is available by prescription. Ask your health care provider for advice about which medicines to use and how to use them based on your health history. Your health care provider will tell you what side effects to look out for if you choose to be on a medicine or therapy. Carefully read the information on the package. Do not use any other product containing nicotine while using a nicotine replacement product.  RELAPSE OR DIFFICULT SITUATIONS Most relapses occur within the first 3 months after quitting. Do not be discouraged if you start smoking again. Remember, most people try several times before finally quitting. You may have symptoms of withdrawal because your body is used to nicotine. You may crave cigarettes, be irritable, feel very hungry, cough often, get headaches, or have difficulty concentrating. The withdrawal symptoms are only temporary. They are strongest  when you first quit, but they will go away within 10-14 days. To reduce the chances of relapse, try to:  Avoid drinking alcohol. Drinking lowers your chances of successfully quitting.  Reduce the amount of caffeine you consume. Once you quit smoking, the amount of caffeine in your body increases and can give you symptoms, such as a rapid heartbeat, sweating, and anxiety.  Avoid smokers because they can make you want to smoke.  Do not let weight gain distract you. Many smokers will gain weight when they quit, usually less than 10 pounds. Eat a healthy diet and stay active. You can always lose the weight gained after you quit.  Find ways to improve your mood other than smoking. FOR MORE INFORMATION  www.smokefree.gov  Document Released: 08/05/2001 Document Revised: 12/26/2013 Document Reviewed: 11/20/2011 [facility] Patient Information 2015 [place], [place]. This information is not intended to replace advice given to you by your health care provider. Make sure you discuss any questions you have with your health care provider. Fat and Cholesterol Control Diet Fat and cholesterol levels in your blood and organs are influenced by your diet. High levels of  fat and cholesterol may lead to diseases of the heart, small and large blood vessels, gallbladder, liver, and pancreas. CONTROLLING FAT AND CHOLESTEROL WITH DIET Although exercise and lifestyle factors are important, your diet is key. That is because certain foods are known to raise cholesterol and others to lower it. The goal is to balance foods for their effect on cholesterol and more importantly, to replace saturated and trans fat with other types of fat, such as monounsaturated fat, polyunsaturated fat, and omega-3 fatty acids. On average, a person should consume no more than 15 to 17 g of saturated fat daily. Saturated and trans fats are considered "bad" fats, and they will raise LDL cholesterol. Saturated fats are primarily found in animal  products such as meats, butter, and cream. However, that does not mean you need to give up all your favorite foods. Today, there are good tasting, low-fat, low-cholesterol substitutes for most of the things you like to eat. Choose low-fat or nonfat alternatives. Choose round or loin cuts of red meat. These types of cuts are lowest in fat and cholesterol. Chicken (without the skin), fish, veal, and ground [place] breast are great choices. Eliminate fatty meats, such as hot dogs and salami. Even shellfish have little or no saturated fat. Have a 3 oz (85 g) portion when you eat lean meat, poultry, or fish. Trans fats are also called "partially hydrogenated oils." They are oils that have been scientifically manipulated so that they are solid at room temperature resulting in a longer shelf life and improved taste and texture of foods in which they are added. Trans fats are found in stick margarine, some tub margarines, cookies, crackers, and baked goods.  When baking and cooking, oils are a great substitute for butter. The monounsaturated oils are especially beneficial since it is believed they lower LDL and raise HDL. The oils you should avoid entirely are saturated tropical oils, such as coconut and palm.  Remember to eat a lot from food groups that are naturally free of saturated and trans fat, including fish, fruit, vegetables, beans, grains (barley, rice, couscous, bulgur wheat), and pasta (without cream sauces).  IDENTIFYING FOODS THAT LOWER FAT AND CHOLESTEROL  Soluble fiber may lower your cholesterol. This type of fiber is found in fruits such as apples, vegetables such as broccoli, potatoes, and carrots, legumes such as beans, peas, and lentils, and grains such as barley. Foods fortified with plant sterols (phytosterol) may also lower cholesterol. You should eat at least 2 g per day of these foods for a cholesterol lowering effect.  Read package labels to identify low-saturated fats, trans fat free, and  low-fat foods at the supermarket. Select cheeses that have only 2 to 3 g saturated fat per ounce. Use a heart-healthy tub margarine that is free of trans fats or partially hydrogenated oil. When buying baked goods (cookies, crackers), avoid partially hydrogenated oils. Breads and muffins should be made from whole grains (whole-wheat or whole oat flour, instead of "flour" or "enriched flour"). Buy non-creamy canned soups with reduced salt and no added fats.  FOOD PREPARATION TECHNIQUES  Never deep-fry. If you must fry, either stir-fry, which uses very little fat, or use non-stick cooking sprays. When possible, broil, bake, or roast meats, and steam vegetables. Instead of putting butter or margarine on vegetables, use lemon and herbs, applesauce, and cinnamon (for squash and sweet potatoes). Use nonfat yogurt, salsa, and low-fat dressings for salads.  LOW-SATURATED FAT / LOW-FAT FOOD SUBSTITUTES Meats / Saturated Fat (g)  Avoid:  Steak, marbled (3 oz/85 g) / 11 g  Choose: Steak, lean (3 oz/85 g) / 4 g  Avoid: Hamburger (3 oz/85 g) / 7 g  Choose: Hamburger, lean (3 oz/85 g) / 5 g  Avoid: Ham (3 oz/85 g) / 6 g  Choose: Ham, lean cut (3 oz/85 g) / 2.4 g  Avoid: Chicken, with skin, dark meat (3 oz/85 g) / 4 g  Choose: Chicken, skin removed, dark meat (3 oz/85 g) / 2 g  Avoid: Chicken, with skin, light meat (3 oz/85 g) / 2.5 g  Choose: Chicken, skin removed, light meat (3 oz/85 g) / 1 g Dairy / Saturated Fat (g)  Avoid: Whole milk (1 cup) / 5 g  Choose: Low-fat milk, 2% (1 cup) / 3 g  Choose: Low-fat milk, 1% (1 cup) / 1.5 g  Choose: Skim milk (1 cup) / 0.3 g  Avoid: Hard cheese (1 oz/28 g) / 6 g  Choose: Skim milk cheese (1 oz/28 g) / 2 to 3 g  Avoid: Cottage cheese, 4% fat (1 cup) / 6.5 g  Choose: Low-fat cottage cheese, 1% fat (1 cup) / 1.5 g  Avoid: Ice cream (1 cup) / 9 g  Choose: Sherbet (1 cup) / 2.5 g  Choose: Nonfat frozen yogurt (1 cup) / 0.3 g  Choose: Frozen fruit  bar / trace  Avoid: Whipped cream (1 tbs) / 3.5 g  Choose: Nondairy whipped topping (1 tbs) / 1 g Condiments / Saturated Fat (g)  Avoid: Mayonnaise (1 tbs) / 2 g  Choose: Low-fat mayonnaise (1 tbs) / 1 g  Avoid: Butter (1 tbs) / 7 g  Choose: Extra light margarine (1 tbs) / 1 g  Avoid: Coconut oil (1 tbs) / 11.8 g  Choose: Olive oil (1 tbs) / 1.8 g  Choose: Corn oil (1 tbs) / 1.7 g  Choose: Safflower oil (1 tbs) / 1.2 g  Choose: Sunflower oil (1 tbs) / 1.4 g  Choose: Soybean oil (1 tbs) / 2.4 g  Choose: Canola oil (1 tbs) / 1 g Document Released: 08/11/2005 Document Revised: 12/06/2012 Document Reviewed: 11/09/2013 [employer] Patient Information 2015 [place], [place]. This information is not intended to replace advice given to you by your health care provider. Make sure you discuss any questions you have with your health care provider.

## 2015-02-13 LAB — BASIC METABOLIC PANEL
BUN: 14 mg/dL (ref 6–23)
CALCIUM: 9.6 mg/dL (ref 8.4–10.5)
CO2: 28 meq/L (ref 19–32)
Chloride: 107 mEq/L (ref 96–112)
Creat: 1.15 mg/dL (ref 0.50–1.35)
Glucose, Bld: 95 mg/dL (ref 70–99)
Potassium: 4.3 mEq/L (ref 3.5–5.3)
Sodium: 141 mEq/L (ref 135–145)

## 2015-02-19 ENCOUNTER — Other Ambulatory Visit: Payer: Medicare Other

## 2015-02-23 ENCOUNTER — Ambulatory Visit: Payer: Medicare Other | Attending: Internal Medicine

## 2015-02-23 DIAGNOSIS — Z9189 Other specified personal risk factors, not elsewhere classified: Secondary | ICD-10-CM

## 2015-02-23 DIAGNOSIS — E785 Hyperlipidemia, unspecified: Secondary | ICD-10-CM

## 2015-02-23 DIAGNOSIS — Z125 Encounter for screening for malignant neoplasm of prostate: Secondary | ICD-10-CM

## 2015-02-23 LAB — LIPID PANEL
CHOL/HDL RATIO: 5.4 ratio
Cholesterol: 195 mg/dL (ref 0–200)
HDL: 36 mg/dL — AB (ref >=40)
LDL Cholesterol: 123 mg/dL — ABNORMAL HIGH (ref 0–99)
Triglycerides: 181 mg/dL — ABNORMAL HIGH (ref <150)
VLDL: 36 mg/dL (ref 0–40)

## 2015-02-23 LAB — CBC
HCT: 41.4 % (ref 39.0–52.0)
HEMOGLOBIN: 14 g/dL (ref 13.0–17.0)
MCH: 29.7 pg (ref 26.0–34.0)
MCHC: 33.8 g/dL (ref 30.0–36.0)
MCV: 87.7 fL (ref 78.0–100.0)
MPV: 11 fL (ref 8.6–12.4)
PLATELETS: 221 10*3/uL (ref 150–400)
RBC: 4.72 MIL/uL (ref 4.22–5.81)
RDW: 15.1 % (ref 11.5–15.5)
WBC: 7.6 10*3/uL (ref 4.0–10.5)

## 2015-02-24 LAB — PSA, MEDICARE: PSA: 0.65 ng/mL (ref <=4.00)

## 2015-02-28 ENCOUNTER — Other Ambulatory Visit: Payer: Self-pay | Admitting: Internal Medicine

## 2015-02-28 DIAGNOSIS — E785 Hyperlipidemia, unspecified: Secondary | ICD-10-CM

## 2015-02-28 MED ORDER — ATORVASTATIN CALCIUM 10 MG PO TABS: 10.0000 mg | ORAL_TABLET | Freq: Every day | ORAL | Status: DC

## 2015-03-02 ENCOUNTER — Telehealth: Payer: Self-pay | Admitting: *Deleted

## 2015-03-02 NOTE — Telephone Encounter (Signed)
-----   Message from Lance Bosch, NP sent at 02/28/2015  9:35 AM EDT ----- Labs normal except cholesterol.  Cholesterol slightly elevated. Please provide appropriate education regarding diet and exercise.  I will send him Atorvastatin 10 mg to take daily. Please have him to pick it up.

## 2015-03-02 NOTE — Telephone Encounter (Signed)
Verified patient's name and date of birth and discussed lab results and diet and exercise changes.  Patient admits to eating a lot of fast food and has agreed to try and limit that and to walk a little each day.  Patient will pick up new prescription.

## 2015-05-14 ENCOUNTER — Encounter: Payer: Worker's Compensation | Attending: Registered Nurse | Admitting: Physical Medicine & Rehabilitation

## 2015-05-14 DIAGNOSIS — M961 Postlaminectomy syndrome, not elsewhere classified: Secondary | ICD-10-CM | POA: Insufficient documentation

## 2015-05-14 DIAGNOSIS — G8929 Other chronic pain: Secondary | ICD-10-CM | POA: Insufficient documentation

## 2015-05-14 DIAGNOSIS — M479 Spondylosis, unspecified: Secondary | ICD-10-CM | POA: Insufficient documentation

## 2015-05-14 DIAGNOSIS — M549 Dorsalgia, unspecified: Secondary | ICD-10-CM | POA: Insufficient documentation

## 2015-07-18 ENCOUNTER — Ambulatory Visit: Payer: Self-pay | Admitting: Internal Medicine

## 2015-07-25 ENCOUNTER — Ambulatory Visit: Payer: Medicare Other | Attending: Internal Medicine | Admitting: Internal Medicine

## 2015-07-25 ENCOUNTER — Encounter: Payer: Self-pay | Admitting: Internal Medicine

## 2015-07-25 VITALS — BP 118/77 | HR 74 | Temp 98.8°F | Resp 17 | Ht 74.0 in | Wt 257.0 lb

## 2015-07-25 DIAGNOSIS — Z79899 Other long term (current) drug therapy: Secondary | ICD-10-CM | POA: Diagnosis not present

## 2015-07-25 DIAGNOSIS — Z0000 Encounter for general adult medical examination without abnormal findings: Secondary | ICD-10-CM | POA: Diagnosis not present

## 2015-07-25 DIAGNOSIS — J00 Acute nasopharyngitis [common cold]: Secondary | ICD-10-CM | POA: Diagnosis not present

## 2015-07-25 DIAGNOSIS — F17200 Nicotine dependence, unspecified, uncomplicated: Secondary | ICD-10-CM | POA: Diagnosis not present

## 2015-07-25 MED ORDER — FLUTICASONE PROPIONATE 50 MCG/ACT NA SUSP: 2.0000 | Freq: Every day | NASAL | Status: DC

## 2015-07-25 MED ORDER — GUAIFENESIN ER 600 MG PO TB12: 600.0000 mg | ORAL_TABLET | Freq: Two times a day (BID) | ORAL | Status: DC | PRN

## 2015-07-25 NOTE — Progress Notes (Signed)
Patient complains of cough congestion and stuffy head for the past week Patient also stated he was supposed to have a colonoscopy but has not yet Heard anything from GI

## 2015-07-25 NOTE — Patient Instructions (Signed)
Upper Respiratory Infection, Adult Most upper respiratory infections (URIs) are a viral infection of the air passages leading to the lungs. A URI affects the nose, throat, and upper air passages. The most common type of URI is nasopharyngitis and is typically referred to as "the common cold." URIs run their course and usually go away on their own. Most of the time, a URI does not require medical attention, but sometimes a bacterial infection in the upper airways can follow a viral infection. This is called a secondary infection. Sinus and middle ear infections are common types of secondary upper respiratory infections. Bacterial pneumonia can also complicate a URI. A URI can worsen asthma and chronic obstructive pulmonary disease (COPD). Sometimes, these complications can require emergency medical care and may be life threatening.  CAUSES Almost all URIs are caused by viruses. A virus is a type of germ and can spread from one person to another.  RISKS FACTORS You may be at risk for a URI if:   You smoke.   You have chronic heart or lung disease.  You have a weakened defense (immune) system.   You are very young or very old.   You have nasal allergies or asthma.  You work in crowded or poorly ventilated areas.  You work in health care facilities or schools. SIGNS AND SYMPTOMS  Symptoms typically develop 2-3 days after you come in contact with a cold virus. Most viral URIs last 7-10 days. However, viral URIs from the influenza virus (flu virus) can last 14-18 days and are typically more severe. Symptoms may include:   Runny or stuffy (congested) nose.   Sneezing.   Cough.   Sore throat.   Headache.   Fatigue.   Fever.   Loss of appetite.   Pain in your forehead, behind your eyes, and over your cheekbones (sinus pain).  Muscle aches.  DIAGNOSIS  Your health care provider may diagnose a URI by:  Physical exam.  Tests to check that your symptoms are not due to  another condition such as:  Strep throat.  Sinusitis.  Pneumonia.  Asthma. TREATMENT  A URI goes away on its own with time. It cannot be cured with medicines, but medicines may be prescribed or recommended to relieve symptoms. Medicines may help:  Reduce your fever.  Reduce your cough.  Relieve nasal congestion. HOME CARE INSTRUCTIONS   Take medicines only as directed by your health care provider.   Gargle warm saltwater or take cough drops to comfort your throat as directed by your health care provider.  Use a warm mist humidifier or inhale steam from a shower to increase air moisture. This may make it easier to breathe.  Drink enough fluid to keep your urine clear or pale yellow.   Eat soups and other clear broths and maintain good nutrition.   Rest as needed.   Return to work when your temperature has returned to normal or as your health care provider advises. You may need to stay home longer to avoid infecting others. You can also use a face mask and careful hand washing to prevent spread of the virus.  Increase the usage of your inhaler if you have asthma.   Do not use any tobacco products, including cigarettes, chewing tobacco, or electronic cigarettes. If you need help quitting, ask your health care provider. PREVENTION  The best way to protect yourself from getting a cold is to practice good hygiene.   Avoid oral or hand contact with people with cold   symptoms.   Wash your hands often if contact occurs.  There is no clear evidence that vitamin C, vitamin E, echinacea, or exercise reduces the chance of developing a cold. However, it is always recommended to get plenty of rest, exercise, and practice good nutrition.  SEEK MEDICAL CARE IF:   You are getting worse rather than better.   Your symptoms are not controlled by medicine.   You have chills.  You have worsening shortness of breath.  You have brown or red mucus.  You have yellow or brown nasal  discharge.  You have pain in your face, especially when you bend forward.  You have a fever.  You have swollen neck glands.  You have pain while swallowing.  You have white areas in the back of your throat. SEEK IMMEDIATE MEDICAL CARE IF:   You have severe or persistent:  Headache.  Ear pain.  Sinus pain.  Chest pain.  You have chronic lung disease and any of the following:  Wheezing.  Prolonged cough.  Coughing up blood.  A change in your usual mucus.  You have a stiff neck.  You have changes in your:  Vision.  Hearing.  Thinking.  Mood. MAKE SURE YOU:   Understand these instructions.  Will watch your condition.  Will get help right away if you are not doing well or get worse.   This information is not intended to replace advice given to you by your health care provider. Make sure you discuss any questions you have with your health care provider.   Document Released: 02/04/2001 Document Revised: 12/26/2014 Document Reviewed: 11/16/2013 [employer] Interactive Patient Education 2016 [employer]  

## 2015-07-25 NOTE — Progress Notes (Signed)
Patient ID: [name], male   DOB: September 27, 1955, 59 y.o.   MRN: [id number]  CC: cold symptoms  HPI: Justin [name] is a 59 y.o. male here today for a follow up visit.  Patient has past medical history of hypertension and HLD. Patient reports that for the past week he has been having complaints of cough, phlegm-white, chest congestion, nasal congestion, sneezing, and watery eyes.  No fevers or chills. Patient has not tried anything for symptom management. He is also concerned because he never received a call for his Colonoscopy. He is now down to smoking less than .5 ppd. He has plans to quit at the beginning of the year.   No Known Allergies Past Medical History  Diagnosis Date  . Hypertension    Current Outpatient Prescriptions on File Prior to Visit  Medication Sig Dispense Refill  . atorvastatin (LIPITOR) 10 MG tablet Take 1 tablet (10 mg total) by mouth daily. 30 tablet 4  . cyclobenzaprine (FLEXERIL) 10 MG tablet Take 1 tablet (10 mg total) by mouth 3 (three) times daily as needed for muscle spasms. (Patient not taking: Reported on 02/12/2015) 60 tablet 3  . furosemide (LASIX) 20 MG tablet Take 0.5 tablets (10 mg total) by mouth daily. (Patient not taking: Reported on 02/12/2015) 30 tablet 6  . gabapentin (NEURONTIN) 100 MG capsule Take 2 capsules (200 mg total) by mouth 3 (three) times daily. (Patient not taking: Reported on 02/12/2015) 180 capsule 3  . methocarbamol (ROBAXIN) 500 MG tablet Take 1 tablet (500 mg total) by mouth every 6 (six) hours as needed for muscle spasms. (Patient not taking: Reported on 02/12/2015) 60 tablet 2  . naproxen (NAPROSYN) 500 MG tablet Take 1daily as needed for pain (with food) (Patient not taking: Reported on 02/12/2015) 30 tablet 1  . omega-3 acid ethyl esters (LOVAZA) 1 G capsule Take 2 capsules (2 g total) by mouth 2 (two) times daily. (Patient not taking: Reported on 02/12/2015) 60 capsule 3   No current facility-administered medications on file prior to  visit.   History reviewed. No pertinent family history. Social History   Social History  . Marital Status: Single    Spouse Name: N/A  . Number of Children: N/A  . Years of Education: N/A   Occupational History  . Not on file.   Social History Main Topics  . Smoking status: Current Every Day Smoker  . Smokeless tobacco: Not on file  . Alcohol Use: No  . Drug Use: No  . Sexual Activity: Not on file   Other Topics Concern  . Not on file   Social History Narrative    Review of Systems: Other than what is stated in HPI, all other systems are negative.   Objective:   Filed Vitals:   07/25/15 0956  BP: 118/77  Pulse: 74  Temp: 98.8 F (37.1 C)  Resp: 17    Physical Exam  Constitutional: He is oriented to person, place, and time.  HENT:  Right Ear: External ear normal.  Left Ear: External ear normal.  Mouth/Throat: Oropharynx is clear and moist.  Eyes: EOM are normal. Pupils are equal, round, and reactive to light. Right eye exhibits no discharge. Left eye exhibits no discharge.  Cardiovascular: Normal rate, regular rhythm and normal heart sounds.   Pulmonary/Chest: Effort normal and breath sounds normal.  Lymphadenopathy:    He has no cervical adenopathy.  Neurological: He is alert and oriented to person, place, and time.    Lab Results  Component Value Date   WBC 7.6 02/23/2015   HGB 14.0 02/23/2015   HCT 41.4 02/23/2015   MCV 87.7 02/23/2015   PLT 221 02/23/2015   Lab Results  Component Value Date   CREATININE 1.15 02/12/2015   BUN 14 02/12/2015   NA 141 02/12/2015   K 4.3 02/12/2015   CL 107 02/12/2015   CO2 28 02/12/2015    Lab Results  Component Value Date   HGBA1C 6.0 09/02/2013   Lipid Panel     Component Value Date/Time   CHOL 195 02/23/2015 1014   TRIG 181* 02/23/2015 1014   HDL 36* 02/23/2015 1014   CHOLHDL 5.4 02/23/2015 1014   VLDL 36 02/23/2015 1014   LDLCALC 123* 02/23/2015 1014       Assessment and plan:   Jaxan was  seen today for uri.  Diagnoses and all orders for this visit:  Acute coryza -     guaiFENesin (MUCINEX) 600 MG 12 hr tablet; Take 1 tablet (600 mg total) by mouth 2 (two) times daily as needed. -     fluticasone (FLONASE) 50 MCG/ACT nasal spray; Place 2 sprays into both nostrils daily. Symptoms are viral and he may do symptom management. If no improvement in 7 days he may call me back.  Preventative health care -     Ambulatory referral to Gastroenterology  Tobacco use disorder Congratulated patient on wanting to quit soon. I have given him resources to hopefully help him stay successful in his journey.  Return if symptoms worsen or fail to improve.       Lance Bosch, [facility] [phone] 07/25/2015, 10:00 AM

## 2015-08-07 ENCOUNTER — Encounter: Payer: Self-pay | Admitting: Internal Medicine

## 2015-10-02 ENCOUNTER — Ambulatory Visit (AMBULATORY_SURGERY_CENTER): Payer: Self-pay

## 2015-10-02 VITALS — Ht 71.0 in | Wt 242.6 lb

## 2015-10-02 DIAGNOSIS — Z800 Family history of malignant neoplasm of digestive organs: Secondary | ICD-10-CM

## 2015-10-02 MED ORDER — NA SULFATE-K SULFATE-MG SULF 17.5-3.13-1.6 GM/177ML PO SOLN: ORAL | Status: DC

## 2015-10-02 MED FILL — SUPREP BOWEL PREP KIT: 17.5-3.13-1 | 1 days supply | Qty: 354 | Fill #0

## 2015-10-02 NOTE — Progress Notes (Signed)
Per pt, no allergies to soy or egg products.Pt not taking any weight loss meds or using  O2 at home. 

## 2015-10-16 ENCOUNTER — Ambulatory Visit (AMBULATORY_SURGERY_CENTER): Payer: Medicare Other | Admitting: Internal Medicine

## 2015-10-16 ENCOUNTER — Encounter: Payer: Self-pay | Admitting: Internal Medicine

## 2015-10-16 VITALS — BP 98/77 | HR 55 | Temp 96.6°F | Resp 10 | Ht 71.0 in | Wt 242.0 lb

## 2015-10-16 DIAGNOSIS — D128 Benign neoplasm of rectum: Secondary | ICD-10-CM

## 2015-10-16 DIAGNOSIS — Z1211 Encounter for screening for malignant neoplasm of colon: Secondary | ICD-10-CM | POA: Diagnosis not present

## 2015-10-16 DIAGNOSIS — Z8371 Family history of colonic polyps: Secondary | ICD-10-CM | POA: Diagnosis not present

## 2015-10-16 DIAGNOSIS — Z800 Family history of malignant neoplasm of digestive organs: Secondary | ICD-10-CM | POA: Diagnosis not present

## 2015-10-16 DIAGNOSIS — D129 Benign neoplasm of anus and anal canal: Secondary | ICD-10-CM

## 2015-10-16 DIAGNOSIS — Z6833 Body mass index (BMI) 33.0-33.9, adult: Secondary | ICD-10-CM | POA: Diagnosis not present

## 2015-10-16 DIAGNOSIS — M545 Low back pain: Secondary | ICD-10-CM | POA: Diagnosis not present

## 2015-10-16 MED ORDER — SODIUM CHLORIDE 0.9 % IV SOLN: 500.0000 mL | INTRAVENOUS | Status: DC

## 2015-10-16 NOTE — Progress Notes (Signed)
  [facility] Post-op Note  Patient: Justin [name]  Procedure(s) Performed: colonoscopy  Patient Location: [facility] - Recovery Area  Anesthesia Type: Deep Sedation/Propofol  Level of Consciousness: awake, oriented and patient cooperative  Airway and Oxygen Therapy: Patient Spontanous Breathing  Post-op Pain: none  Post-op Assessment:  Post-op Vital signs reviewed, Patient's Cardiovascular Status Stable, Respiratory Function Stable, Patent Airway, No signs of Nausea or vomiting and Pain level controlled  Post-op Vital Signs: Reviewed and stable  Complications: No apparent anesthesia complications  Eris Hannan E 10:35 AM

## 2015-10-16 NOTE — Op Note (Signed)
[place]  [address]. [place], [id number]   COLONOSCOPY PROCEDURE REPORT  PATIENT: Justin [name], Justin [name]  MR#: [id number] BIRTHDATE: 08/06/1956 , 59  yrs. old GENDER: male ENDOSCOPIST: Jerene Bears, MD REFERRED [id number] Advani, MD PROCEDURE DATE:  10/16/2015 PROCEDURE:   Colonoscopy, screening and Colonoscopy with snare polypectomy First Screening Colonoscopy - Avg.  risk and is 50 yrs.  old or older Yes.  Prior Negative Screening - Now for repeat screening. N/A  History of Adenoma - Now for follow-up colonoscopy & has been > or = to 3 yrs.  N/A  Polyps removed today? Yes ASA CLASS:   Class II INDICATIONS:Screening for colonic neoplasia and FH Colon Adenoma (father). MEDICATIONS: Monitored anesthesia care and Propofol 200 mg IV  DESCRIPTION OF PROCEDURE:   After the risks benefits and alternatives of the procedure were thoroughly explained, informed consent was obtained.  The digital rectal exam revealed no rectal mass.   The LB [id number]  endoscope was introduced through the anus and advanced to the cecum, which was identified by both the appendix and ileocecal valve. No adverse events experienced. The quality of the prep was good.  (Suprep was used)  The instrument was then slowly withdrawn as the colon was fully examined. Estimated blood loss is zero unless otherwise noted in this procedure report.      COLON FINDINGS: A sessile polyp measuring 5 mm in size was found in the rectum.  A polypectomy was performed with a cold snare.  The resection was complete, the polyp tissue was completely retrieved and sent to histology.   The examination was otherwise normal. Retroflexed views revealed internal hemorrhoids. The time to cecum = 0.4 Withdrawal time = 10.8   The scope was withdrawn and the procedure completed.  COMPLICATIONS: There were no immediate complications.  ENDOSCOPIC IMPRESSION: 1.   Sessile polyp was found in the rectum; polypectomy  was performed with a cold snare 2.   The examination was otherwise normal  RECOMMENDATIONS: 1.  Await pathology results 2.  Repeat Colonoscopy in 5 years. 3.  You will receive a letter within 1-2 weeks with the results of your biopsy as well as final recommendations.  Please call my office if you have not received a letter after 3 weeks.  eSigned:  Jerene Bears, MD 10/16/2015 10:33 AM   cc:  the patient, Dr. Annitta Needs

## 2015-10-16 NOTE — Progress Notes (Signed)
Called to room to assist during endoscopic procedure.  Patient ID and intended procedure confirmed with present staff. Received instructions for my participation in the procedure from the performing physician.  

## 2015-10-16 NOTE — Progress Notes (Signed)
No problems noted in the recovery room. maw 

## 2015-10-16 NOTE — Progress Notes (Signed)
No egg or soy allergy known to patient  No issues with past sedation with any surgeries  or procedures, no intubation problems  No diet pills per patient No home 02 use per patient  No blood thinners per patient    

## 2015-10-16 NOTE — Patient Instructions (Signed)
YOU HAD AN ENDOSCOPIC PROCEDURE TODAY AT THE [facility]:   Refer to the procedure report that was given to you for any specific questions about what was found during the examination.  If the procedure report does not answer your questions, please call your gastroenterologist to clarify.  If you requested that your care partner not be given the details of your procedure findings, then the procedure report has been included in a sealed envelope for you to review at your convenience later.  YOU SHOULD EXPECT: Some feelings of bloating in the abdomen. Passage of more gas than usual.  Walking can help get rid of the air that was put into your GI tract during the procedure and reduce the bloating. If you had a lower endoscopy (such as a colonoscopy or flexible sigmoidoscopy) you may notice spotting of blood in your stool or on the toilet paper. If you underwent a bowel prep for your procedure, you may not have a normal bowel movement for a few days.  Please Note:  You might notice some irritation and congestion in your nose or some drainage.  This is from the oxygen used during your procedure.  There is no need for concern and it should clear up in a day or so.  SYMPTOMS TO REPORT IMMEDIATELY:   Following lower endoscopy (colonoscopy or flexible sigmoidoscopy):  Excessive amounts of blood in the stool  Significant tenderness or worsening of abdominal pains  Swelling of the abdomen that is new, acute  Fever of 100F or higher   For urgent or emergent issues, a gastroenterologist can be reached at any hour by calling [phone].   DIET: Your first meal following the procedure should be a small meal and then it is ok to progress to your normal diet. Heavy or fried foods are harder to digest and may make you feel nauseous or bloated.  Likewise, meals heavy in dairy and vegetables can increase bloating.  Drink plenty of fluids but you should avoid alcoholic beverages for 24  hours.  ACTIVITY:  You should plan to take it easy for the rest of today and you should NOT DRIVE or use heavy machinery until tomorrow (because of the sedation medicines used during the test).    FOLLOW UP: Our staff will call the number listed on your records the next business day following your procedure to check on you and address any questions or concerns that you may have regarding the information given to you following your procedure. If we do not reach you, we will leave a message.  However, if you are feeling well and you are not experiencing any problems, there is no need to return our call.  We will assume that you have returned to your regular daily activities without incident.  If any biopsies were taken you will be contacted by phone or by letter within the next 1-3 weeks.  Please call us at [phone] if you have not heard about the biopsies in 3 weeks.    SIGNATURES/CONFIDENTIALITY: You and/or your care partner have signed paperwork which will be entered into your electronic medical record.  These signatures attest to the fact that that the information above on your After Visit Summary has been reviewed and is understood.  Full responsibility of the confidentiality of this discharge information lies with you and/or your care-partner.   Handouts were given to your care partner on polyps and hemorrhoids. You may resume your current medications today. Await biopsy results. Please call   call if any questions or concerns.

## 2015-10-17 ENCOUNTER — Telehealth: Payer: Self-pay

## 2015-10-17 NOTE — Telephone Encounter (Signed)
No answer, left vm to cb if questions or concerns following procedure.

## 2015-10-22 ENCOUNTER — Encounter: Payer: Self-pay | Admitting: Internal Medicine

## 2016-03-03 ENCOUNTER — Ambulatory Visit: Payer: Medicare Other | Attending: Internal Medicine | Admitting: Internal Medicine

## 2016-03-03 ENCOUNTER — Encounter: Payer: Self-pay | Admitting: Internal Medicine

## 2016-03-03 VITALS — BP 132/83 | HR 77 | Temp 98.6°F | Resp 16 | Wt 272.0 lb

## 2016-03-03 DIAGNOSIS — E785 Hyperlipidemia, unspecified: Secondary | ICD-10-CM | POA: Diagnosis not present

## 2016-03-03 DIAGNOSIS — R7303 Prediabetes: Secondary | ICD-10-CM | POA: Insufficient documentation

## 2016-03-03 DIAGNOSIS — Z79899 Other long term (current) drug therapy: Secondary | ICD-10-CM | POA: Insufficient documentation

## 2016-03-03 DIAGNOSIS — I10 Essential (primary) hypertension: Secondary | ICD-10-CM | POA: Insufficient documentation

## 2016-03-03 DIAGNOSIS — Z720 Tobacco use: Secondary | ICD-10-CM | POA: Insufficient documentation

## 2016-03-03 DIAGNOSIS — M961 Postlaminectomy syndrome, not elsewhere classified: Secondary | ICD-10-CM | POA: Diagnosis not present

## 2016-03-03 DIAGNOSIS — E669 Obesity, unspecified: Secondary | ICD-10-CM | POA: Diagnosis not present

## 2016-03-03 DIAGNOSIS — F17210 Nicotine dependence, cigarettes, uncomplicated: Secondary | ICD-10-CM | POA: Diagnosis not present

## 2016-03-03 DIAGNOSIS — Z8249 Family history of ischemic heart disease and other diseases of the circulatory system: Secondary | ICD-10-CM | POA: Insufficient documentation

## 2016-03-03 DIAGNOSIS — Z808 Family history of malignant neoplasm of other organs or systems: Secondary | ICD-10-CM | POA: Diagnosis not present

## 2016-03-03 DIAGNOSIS — M47817 Spondylosis without myelopathy or radiculopathy, lumbosacral region: Secondary | ICD-10-CM | POA: Diagnosis not present

## 2016-03-03 LAB — LIPID PANEL
Cholesterol: 202 mg/dL — ABNORMAL HIGH (ref 125–200)
HDL: 48 mg/dL (ref >=40)
LDL CALC: 118 mg/dL (ref <130)
Total CHOL/HDL Ratio: 4.2 Ratio (ref <=5.0)
Triglycerides: 178 mg/dL — ABNORMAL HIGH (ref <150)
VLDL: 36 mg/dL — ABNORMAL HIGH (ref <30)

## 2016-03-03 LAB — CBC WITH DIFFERENTIAL/PLATELET
Basophils Absolute: 68 cells/uL (ref 0–200)
Basophils Relative: 1 %
EOS ABS: 136 {cells}/uL (ref 15–500)
Eosinophils Relative: 2 %
HEMATOCRIT: 40.8 % (ref 38.5–50.0)
Hemoglobin: 13.7 g/dL (ref 13.2–17.1)
LYMPHS PCT: 40 %
Lymphs Abs: 2720 cells/uL (ref 850–3900)
MCH: 29.8 pg (ref 27.0–33.0)
MCHC: 33.6 g/dL (ref 32.0–36.0)
MCV: 88.7 fL (ref 80.0–100.0)
MONO ABS: 680 {cells}/uL (ref 200–950)
MONOS PCT: 10 %
MPV: 10.9 fL (ref 7.5–12.5)
NEUTROS PCT: 47 %
Neutro Abs: 3196 cells/uL (ref 1500–7800)
PLATELETS: 227 10*3/uL (ref 140–400)
RBC: 4.6 MIL/uL (ref 4.20–5.80)
RDW: 14.3 % (ref 11.0–15.0)
WBC: 6.8 10*3/uL (ref 3.8–10.8)

## 2016-03-03 LAB — BASIC METABOLIC PANEL WITH GFR
BUN: 17 mg/dL (ref 7–25)
CHLORIDE: 107 mmol/L (ref 98–110)
CO2: 26 mmol/L (ref 20–31)
Calcium: 8.7 mg/dL (ref 8.6–10.3)
Creat: 1.2 mg/dL (ref 0.70–1.25)
GFR, Est African American: 74 mL/min (ref >=60)
GFR, Est Non African American: 64 mL/min (ref >=60)
Glucose, Bld: 117 mg/dL — ABNORMAL HIGH (ref 65–99)
POTASSIUM: 4.3 mmol/L (ref 3.5–5.3)
SODIUM: 141 mmol/L (ref 135–146)

## 2016-03-03 LAB — POCT GLYCOSYLATED HEMOGLOBIN (HGB A1C): HEMOGLOBIN A1C: 5.7

## 2016-03-03 MED ORDER — ATORVASTATIN CALCIUM 10 MG PO TABS
10.0000 mg | ORAL_TABLET | Freq: Every day | ORAL | Status: DC
Start: 2016-03-03 — End: 2016-11-20

## 2016-03-03 MED ORDER — GABAPENTIN 100 MG PO CAPS: 200.0000 mg | ORAL_CAPSULE | Freq: Three times a day (TID) | ORAL | Status: DC

## 2016-03-03 MED ORDER — CYCLOBENZAPRINE HCL 10 MG PO TABS: 10.0000 mg | ORAL_TABLET | Freq: Three times a day (TID) | ORAL | Status: DC | PRN

## 2016-03-03 MED ORDER — ATORVASTATIN CALCIUM 10 MG PO TABS: 10.0000 mg | ORAL_TABLET | Freq: Every day | ORAL | Status: DC

## 2016-03-03 NOTE — Patient Instructions (Addendum)
It was a pleasure seeing you today.  Signup for MyChart ! We can then communicate faster and easier with each other via e-mail.   - Smoking Cessation, Tips for Success If you are ready to quit smoking, congratulations! You have chosen to help yourself be healthier. Cigarettes bring nicotine, tar, carbon monoxide, and other irritants into your body. Your lungs, heart, and blood vessels will be able to work better without these poisons. There are many different ways to quit smoking. Nicotine gum, nicotine patches, a nicotine inhaler, or nicotine nasal spray can help with physical craving. Hypnosis, support groups, and medicines help break the habit of smoking. WHAT THINGS CAN I DO TO MAKE QUITTING EASIER?  Here are some tips to help you quit for good: 1. Pick a date when you will quit smoking completely. Tell all of your friends and family about your plan to quit on that date. 2. Do not try to slowly cut down on the number of cigarettes you are smoking. Pick a quit date and quit smoking completely starting on that day. 3. Throw away all cigarettes.  4. Clean and remove all ashtrays from your home, work, and car. 5. On a card, write down your reasons for quitting. Carry the card with you and read it when you get the urge to smoke. 6. Cleanse your body of nicotine. Drink enough water and fluids to keep your urine clear or pale yellow. Do this after quitting to flush the nicotine from your body. 7. Learn to predict your moods. Do not let a bad situation be your excuse to have a cigarette. Some situations in your life might tempt you into wanting a cigarette. 8. Never have "just one" cigarette. It leads to wanting another and another. Remind yourself of your decision to quit. 9. Change habits associated with smoking. If you smoked while driving or when feeling stressed, try other activities to replace smoking. Stand up when drinking your coffee. Brush your teeth after eating. Sit in a different chair  when you read the paper. Avoid alcohol while trying to quit, and try to drink fewer caffeinated beverages. Alcohol and caffeine may urge you to smoke. 10. Avoid foods and drinks that can trigger a desire to smoke, such as sugary or spicy foods and alcohol. 11. Ask people who smoke not to smoke around you. 12. Have something planned to do right after eating or having a cup of coffee. For example, plan to take a walk or exercise. 13. Try a relaxation exercise to calm you down and decrease your stress. Remember, you may be tense and nervous for the first 2 weeks after you quit, but this will pass. 70. Find new activities to keep your hands busy. Play with a pen, coin, or rubber band. Doodle or draw things on paper. 15. Brush your teeth right after eating. This will help cut down on the craving for the taste of tobacco after meals. You can also try mouthwash.  16. Use oral substitutes in place of cigarettes. Try using lemon drops, carrots, cinnamon sticks, or chewing gum. Keep them handy so they are available when you have the urge to smoke. 17. When you have the urge to smoke, try deep breathing. 30. Designate your home as a nonsmoking area. 19. If you are a heavy smoker, ask your health care provider about a prescription for nicotine chewing gum. It can ease your withdrawal from nicotine. 20. Reward yourself. Set aside the cigarette money you save and buy yourself something nice.  21. Look for support from others. Join a support group or smoking cessation program. Ask someone at home or at work to help you with your plan to quit smoking. 22. Always ask yourself, "Do I need this cigarette or is this just a reflex?" Tell yourself, "Today, I choose not to smoke," or "I do not want to smoke." You are reminding yourself of your decision to quit. 23. Do not replace cigarette smoking with electronic cigarettes (commonly called e-cigarettes). The safety of e-cigarettes is unknown, and some may contain harmful  chemicals. 24. If you relapse, do not give up! Plan ahead and think about what you will do the next time you get the urge to smoke. HOW WILL I FEEL WHEN I QUIT SMOKING? You may have symptoms of withdrawal because your body is used to nicotine (the addictive substance in cigarettes). You may crave cigarettes, be irritable, feel very hungry, cough often, get headaches, or have difficulty concentrating. The withdrawal symptoms are only temporary. They are strongest when you first quit but will go away within 10-14 days. When withdrawal symptoms occur, stay in control. Think about your reasons for quitting. Remind yourself that these are signs that your body is healing and getting used to being without cigarettes. Remember that withdrawal symptoms are easier to treat than the major diseases that smoking can cause.  Even after the withdrawal is over, expect periodic urges to smoke. However, these cravings are generally short lived and will go away whether you smoke or not. Do not smoke! WHAT RESOURCES ARE AVAILABLE TO HELP ME QUIT SMOKING? Your health care provider can direct you to community resources or hospitals for support, which may include: 1. Group support. 2. Education. 3. Hypnosis. 4. Therapy.   This information is not intended to replace advice given to you by your health care provider. Make sure you discuss any questions you have with your health care provider.   Document Released: 05/09/2004 Document Revised: 09/01/2014 Document Reviewed: 01/27/2013 Elsevier Interactive Patient Education 2016 Elsevier Inc.  - Low-Sodium Eating Plan Sodium raises blood pressure and causes water to be held in the body. Getting less sodium from food will help lower your blood pressure, reduce any swelling, and protect your heart, liver, and kidneys. We get sodium by adding salt (sodium chloride) to food. Most of our sodium comes from canned, boxed, and frozen foods. Restaurant foods, fast foods, and pizza are  also very high in sodium. Even if you take medicine to lower your blood pressure or to reduce fluid in your body, getting less sodium from your food is important. WHAT IS MY PLAN? Most people should limit their sodium intake to 2,300 mg a day. Your health care provider recommends that you limit your sodium intake to __________ a day.  WHAT DO I NEED TO KNOW ABOUT THIS EATING PLAN? For the low-sodium eating plan, you will follow these general guidelines: 25. Choose foods with a % Daily Value for sodium of less than 5% (as listed on the food label).  26. Use salt-free seasonings or herbs instead of table salt or sea salt.  27. Check with your health care provider or pharmacist before using salt substitutes.  28. Eat fresh foods. 29. Eat more vegetables and fruits. 30. Limit canned vegetables. If you do use them, rinse them well to decrease the sodium.  31. Limit cheese to 1 oz (28 g) per day.  32. Eat lower-sodium products, often labeled as "lower sodium" or "no salt added." 33. Avoid foods that  contain monosodium glutamate (MSG). MSG is sometimes added to [place] food and some canned foods. 34. Check food labels (Nutrition Facts labels) on foods to learn how much sodium is in one serving. 63. Eat more home-cooked food and less restaurant, buffet, and fast food. 36. When eating at a restaurant, ask that your food be prepared with less salt, or no salt if possible.  HOW DO I READ FOOD LABELS FOR SODIUM INFORMATION? The Nutrition Facts label lists the amount of sodium in one serving of the food. If you eat more than one serving, you must multiply the listed amount of sodium by the number of servings. Food labels may also identify foods as: 5. Sodium free--Less than 5 mg in a serving. 6. Very low sodium--35 mg or less in a serving. 7. Low sodium--140 mg or less in a serving. 8. Light in sodium--50% less sodium in a serving. For example, if a food that usually has 300 mg of sodium is  changed to become light in sodium, it will have 150 mg of sodium. 9. Reduced sodium--25% less sodium in a serving. For example, if a food that usually has 400 mg of sodium is changed to reduced sodium, it will have 300 mg of sodium. WHAT FOODS CAN I EAT? Grains Low-sodium cereals, including oats, puffed wheat and rice, and shredded wheat cereals. Low-sodium crackers. Unsalted rice and pasta. Lower-sodium bread.  Vegetables Frozen or fresh vegetables. Low-sodium or reduced-sodium canned vegetables. Low-sodium or reduced-sodium tomato sauce and paste. Low-sodium or reduced-sodium tomato and vegetable juices.  Fruits Fresh, frozen, and canned fruit. Fruit juice.  Meat and Other Protein Products Low-sodium canned tuna and salmon. Fresh or frozen meat, poultry, seafood, and fish. Lamb. Unsalted nuts. Dried beans, peas, and lentils without added salt. Unsalted canned beans. Homemade soups without salt. Eggs.  Dairy Milk. Soy milk. Ricotta cheese. Low-sodium or reduced-sodium cheeses. Yogurt.  Condiments Fresh and dried herbs and spices. Salt-free seasonings. Onion and garlic powders. Low-sodium varieties of mustard and ketchup. Fresh or refrigerated horseradish. Lemon juice.  Fats and Oils Reduced-sodium salad dressings. Unsalted butter.  Other Unsalted popcorn and pretzels.  The items listed above may not be a complete list of recommended foods or beverages. Contact your dietitian for more options. WHAT FOODS ARE NOT RECOMMENDED? Grains Instant hot cereals. Bread stuffing, pancake, and biscuit mixes. Croutons. Seasoned rice or pasta mixes. Noodle soup cups. Boxed or frozen macaroni and cheese. Self-rising flour. Regular salted crackers. Vegetables Regular canned vegetables. Regular canned tomato sauce and paste. Regular tomato and vegetable juices. Frozen vegetables in sauces. Salted [place] fries. Olives. Angie Fava. Relishes. Sauerkraut. Salsa. Meat and Other Protein Products Salted,  canned, smoked, spiced, or pickled meats, seafood, or fish. Bacon, ham, sausage, hot dogs, corned beef, chipped beef, and packaged luncheon meats. Salt pork. Jerky. Pickled herring. Anchovies, regular canned tuna, and sardines. Salted nuts. Dairy Processed cheese and cheese spreads. Cheese curds. Blue cheese and cottage cheese. Buttermilk.  Condiments Onion and garlic salt, seasoned salt, table salt, and sea salt. Canned and packaged gravies. Worcestershire sauce. Tartar sauce. Barbecue sauce. Teriyaki sauce. Soy sauce, including reduced sodium. Steak sauce. Fish sauce. Oyster sauce. Cocktail sauce. Horseradish that you find on the shelf. Regular ketchup and mustard. Meat flavorings and tenderizers. Bouillon cubes. Hot sauce. Tabasco sauce. Marinades. Taco seasonings. Relishes. Fats and Oils Regular salad dressings. Salted butter. Margarine. Ghee. Bacon fat.  Other Potato and tortilla chips. Corn chips and puffs. Salted popcorn and pretzels. Canned or dried soups. Pizza. Frozen entrees  and pot pies.  The items listed above may not be a complete list of foods and beverages to avoid. Contact your dietitian for more information.   This information is not intended to replace advice given to you by your health care provider. Make sure you discuss any questions you have with your health care provider.   Document Released: 01/31/2002 Document Revised: 09/01/2014 Document Reviewed: 06/15/2013 Elsevier Interactive Patient Education 2016 Elsevier Inc.  - Back Exercises If you have pain in your back, do these exercises 2-3 times each day or as told by your doctor. When the pain goes away, do the exercises once each day, but repeat the steps more times for each exercise (do more repetitions). If you do not have pain in your back, do these exercises once each day or as told by your doctor. EXERCISES Single Knee to Chest Do these steps 3-5 times in a row for each leg: 37. Lie on your back on a firm  bed or the floor with your legs stretched out. 53. Bring one knee to your chest. 39. Hold your knee to your chest by grabbing your knee or thigh. 40. Pull on your knee until you feel a gentle stretch in your lower back. 41. Keep doing the stretch for 10-30 seconds. 42. Slowly let go of your leg and straighten it. Pelvic Tilt Do these steps 5-10 times in a row: 10. Lie on your back on a firm bed or the floor with your legs stretched out. [place] your knees so they point up to the ceiling. Your feet should be flat on the floor. 12. Tighten your lower belly (abdomen) muscles to press your lower back against the floor. This will make your tailbone point up to the ceiling instead of pointing down to your feet or the floor. 13. Stay in this position for 5-10 seconds while you gently tighten your muscles and breathe evenly. Cat-Cow Do these steps until your lower back bends more easily: 1. Get on your hands and knees on a firm surface. Keep your hands under your shoulders, and keep your knees under your hips. You may put padding under your knees. 2. Let your head hang down, and make your tailbone point down to the floor so your lower back is round like the back of a cat. 3. Stay in this position for 5 seconds. 4. Slowly lift your head and make your tailbone point up to the ceiling so your back hangs low (sags) like the back of a cow. 5. Stay in this position for 5 seconds. Press-Ups Do these steps 5-10 times in a row: 1. Lie on your belly (face-down) on the floor. 2. Place your hands near your head, about shoulder-width apart. 3. While you keep your back relaxed and keep your hips on the floor, slowly straighten your arms to raise the top half of your body and lift your shoulders. Do not use your back muscles. To make yourself more comfortable, you may change where you place your hands. 4. Stay in this position for 5 seconds. 5. Slowly return to lying flat on the floor. Bridges Do these steps 10  times in a row: 1. Lie on your back on a firm surface. 2. Bend your knees so they point up to the ceiling. Your feet should be flat on the floor. 3. Tighten your butt muscles and lift your butt off of the floor until your waist is almost as high as your knees. If you do not feel the  muscles working in your butt and the back of your thighs, slide your feet 1-2 inches farther away from your butt. 4. Stay in this position for 3-5 seconds. 5. Slowly lower your butt to the floor, and let your butt muscles relax. If this exercise is too easy, try doing it with your arms crossed over your chest. Belly Crunches Do these steps 5-10 times in a row: 1. Lie on your back on a firm bed or the floor with your legs stretched out. 2. Bend your knees so they point up to the ceiling. Your feet should be flat on the floor. 3. Cross your arms over your chest. 4. Tip your chin a little bit toward your chest but do not bend your neck. 5. Tighten your belly muscles and slowly raise your chest just enough to lift your shoulder blades a tiny bit off of the floor. 6. Slowly lower your chest and your head to the floor. Back Lifts Do these steps 5-10 times in a row: 1. Lie on your belly (face-down) with your arms at your sides, and rest your forehead on the floor. 2. Tighten the muscles in your legs and your butt. 3. Slowly lift your chest off of the floor while you keep your hips on the floor. Keep the back of your head in line with the curve in your back. Look at the floor while you do this. 4. Stay in this position for 3-5 seconds. 5. Slowly lower your chest and your face to the floor. GET HELP IF:  Your back pain gets a lot worse when you do an exercise.  Your back pain does not lessen 2 hours after you exercise. If you have any of these problems, stop doing the exercises. Do not do them again unless your doctor says it is okay. GET HELP RIGHT AWAY IF:  You have sudden, very bad back pain. If this happens, stop  doing the exercises. Do not do them again unless your doctor says it is okay.   This information is not intended to replace advice given to you by your health care provider. Make sure you discuss any questions you have with your health care provider.   Document Released: 09/13/2010 Document Revised: 05/02/2015 Document Reviewed: 10/05/2014 Elsevier Interactive Patient Education [employer].

## 2016-03-03 NOTE — Progress Notes (Signed)
Justin [name], is a 60 y.o. male  [id number]  [id number]  DOB - 1955-09-25  CC:  Chief Complaint  Patient presents with  . Establish Care       HPI: Justin [name] is a 60 y.o. male here today to establish medical care, last seen in clinic 11/16. He had colonoscopy 2/17, and needs repeat in 5 years due to tubular adenoma.   Significant pmhx of chronic back pain, HLD, prediabes as of 2015 labs.  Of note, he states he is out of all his meds, perhaps months now.  Is watching what he eats and tries to avoid salts.   C/o on increasing thirst and polyuria, nocturia intermittent as well.  Denies weight loss, more of weight gain 2nd to sedentary lifestyle.   Due to his chronic back pain, lower, which will radiate down his left or right leg at times, his mobility is limited. Walks sometimes, but not often for exercise.  Unable to walk long distances, asked for parking decal today.  Asked for renewal of the pain meds he was on last year (neurontin 200tid and flexeril prn) which appeared to help.  Smokes 1 pack every 3 days or so.  Not ready to stop.  Says he could stop on own if wanted to, but living situation makes it difficult currently.  He drinks etoh occasionally.  Patient has No headache, No chest pain, No abdominal pain - No Nausea, No new weakness tingling or numbness, No Cough - SOB.    Review of Systems: Per hpi, o/w all systems reviewed and negative.   No Known Allergies Past Medical History  Diagnosis Date  . Hypertension     no meds  . Arthritis    Current Outpatient Prescriptions on File Prior to Visit  Medication Sig Dispense Refill  . furosemide (LASIX) 20 MG tablet Take 0.5 tablets (10 mg total) by mouth daily. 30 tablet 6  . methocarbamol (ROBAXIN) 500 MG tablet Take 1 tablet (500 mg total) by mouth every 6 (six) hours as needed for muscle spasms. 60 tablet 2  . omega-3 acid ethyl esters (LOVAZA) 1 G capsule Take 2 capsules (2 g total) by mouth 2 (two) times  daily. 60 capsule 3  . fluticasone (FLONASE) 50 MCG/ACT nasal spray Place 2 sprays into both nostrils daily. (Patient not taking: Reported on 10/02/2015) 16 g 1  . guaiFENesin (MUCINEX) 600 MG 12 hr tablet Take 1 tablet (600 mg total) by mouth 2 (two) times daily as needed. (Patient not taking: Reported on 10/02/2015) 30 tablet 1  . naproxen (NAPROSYN) 500 MG tablet Take 1daily as needed for pain (with food) (Patient not taking: Reported on 10/16/2015) 30 tablet 1   No current facility-administered medications on file prior to visit.   Family History  Problem Relation Age of Onset  . Colon cancer Father   . Heart disease Father   . Heart disease Mother    Social History   Social History  . Marital Status: Single    Spouse Name: N/A  . Number of Children: N/A  . Years of Education: N/A   Occupational History  . Not on file.   Social History Main Topics  . Smoking status: Current Every Day Smoker -- 0.50 packs/day    Types: Cigarettes  . Smokeless tobacco: Never Used  . Alcohol Use: No  . Drug Use: No  . Sexual Activity: Not on file   Other Topics Concern  . Not on file   Social History Narrative  Objective:   Filed Vitals:   03/03/16 1505  BP: 132/83  Pulse: 77  Temp: 98.6 F (37 C)  Resp: 16    Filed Weights   03/03/16 1505  Weight: 272 lb (123.378 kg)    BP Readings from Last 3 Encounters:  03/03/16 132/83  10/16/15 98/77  07/25/15 118/77    Physical Exam: Constitutional: Patient appears well-developed and well-nourished. No distress. AAOx3, morbid obese, short statured, pleasant., ambulating w/o difficulty. HENT: Normocephalic, atraumatic, External right and left ear normal. Oropharynx is clear and moist.  Eyes: Conjunctivae and EOM are normal. PERRL, no scleral icterus. Neck: Normal ROM. Neck supple. No JVD.  CVS: RRR, S1/S2 +, no murmurs, no gallops, no carotid bruit.  Pulmonary: Effort and breath sounds normal, no stridor, rhonchi, wheezes, rales.    Abdominal: Soft. BS +, obese, no distension, tenderness, rebound or guarding.  Musculoskeletal: Normal range of motion. No edema and no tenderness.  LE: bilat/ no c/c/e, pulses 2+ bilateral. Neuro: Alert.  muscle tone coordination wnl. No cranial nerve deficit grossly. Skin: Skin is warm and dry. No rash noted. Not diaphoretic. No erythema. No pallor. Psychiatric: Normal mood and affect. Behavior, judgment, thought content normal.  Lab Results  Component Value Date   WBC 7.6 02/23/2015   HGB 14.0 02/23/2015   HCT 41.4 02/23/2015   MCV 87.7 02/23/2015   PLT 221 02/23/2015   Lab Results  Component Value Date   CREATININE 1.15 02/12/2015   BUN 14 02/12/2015   NA 141 02/12/2015   K 4.3 02/12/2015   CL 107 02/12/2015   CO2 28 02/12/2015    Lab Results  Component Value Date   HGBA1C 6.0 09/02/2013   Lipid Panel     Component Value Date/Time   CHOL 195 02/23/2015 1014   TRIG 181* 02/23/2015 1014   HDL 36* 02/23/2015 1014   CHOLHDL 5.4 02/23/2015 1014   VLDL 36 02/23/2015 1014   LDLCALC 123* 02/23/2015 1014       Depression screen PHQ 2/9 03/03/2016 02/12/2015  Decreased Interest 1 0  Down, Depressed, Hopeless - 0  PHQ - 2 Score 1 0  Altered sleeping 1 -  Tired, decreased energy 1 -  Change in appetite 1 -  Feeling bad or failure about yourself  1 -  Trouble concentrating 0 -  Moving slowly or fidgety/restless 2 -  Suicidal thoughts 0 -  PHQ-9 Score 7 -  Difficult doing work/chores Not difficult at all -   08/2014 lumbar xray CLINICAL DATA: Low back pain, right leg numbness, no recent injury, previous back surgery approx 10 yrs ago  EXAM: LUMBAR SPINE - COMPLETE 4+ VIEW  COMPARISON: 06/24/2004  FINDINGS: No fracture. No spondylolisthesis.  Mild loss of disc height at L3-L4 with moderate loss disc height at L4-L5 and L5-S1.  Facet degenerative changes are noted, mild, at L4-L5 and L5-S1.  Normal soft tissues.  IMPRESSION: 1. No fracture or  acute finding. 2. Degenerative changes in the lower lumbar spine as described.   Electronically Signed  By: Lajean Manes M.D.  On: 09/22/2014 17:07   Assessment and plan:   1. HLD (hyperlipidemia) /dyslipidemia Off meds for months, last meal was 2am last night, currently fasting. - chk lipids - atorvastatin (LIPITOR) 10 MG tablet; Take 1 tablet (10 mg total) by mouth daily.  Dispense: 90 tablet; Refill: 4 - CBC with Differential - Lipid Panel - BASIC METABOLIC PANEL WITH GFR  2. Tobacco abuse Recd welbutrin, pt is not interested. Tob cessation  recd, tips given.  3. Lumbar post-laminectomy syndrome renewed - gabapentin (NEURONTIN) 100 MG capsule; Take 2 capsules (200 mg total) by mouth 3 (three) times daily.  Dispense: 180 capsule; Refill: 3 - flexeril 10mg  prn,   4. Prediabetes , h/o - aic 6 (08/2013) Concern for overt dm. Labs today. - HgB A1c 5.7 - Microalbumin/Creatinine Ratio, Urine   5. Obesity  Recd heart healthy diet, increase exercise. Start slow by walking.  6. Health maintenance - up to date on colonoscopy 2/17 -recd pneumococcal vaccine, pt declined. - psa neg 02/2015  7. Disability parking placard - pt requested, filled out for pt today.  8. Hx of being on lasix for ?htn - bp controlled currently, will follow.  Return in about 3 months (around 06/03/2016).  The patient was given clear instructions to go to ER or return to medical center if symptoms don't improve, worsen or new problems develop. The patient verbalized understanding. The patient was told to call to get lab results if they haven't heard anything in the next week.    This note has been created with Surveyor, quantity. Any transcriptional errors are unintentional.   Maren Reamer, MD, [place], [place]   03/03/2016, 3:22 PM

## 2016-03-04 LAB — MICROALBUMIN / CREATININE URINE RATIO
CREATININE, URINE: 97 mg/dL (ref 20–370)
Microalb Creat Ratio: 2 mcg/mg creat (ref <30)
Microalb, Ur: 0.2 mg/dL

## 2016-03-05 ENCOUNTER — Telehealth: Payer: Self-pay

## 2016-03-05 NOTE — Telephone Encounter (Signed)
Contacted patient to go over lab result patient is aware of lab results

## 2016-03-31 MED FILL — ?CYCLOBENZAPRINE 10 MG TABL: 10 | 20 days supply | Qty: 60 | Fill #0

## 2016-03-31 MED FILL — ?ATORVASTATIN 10 MG TABLET: 10 | 30 days supply | Qty: 30 | Fill #0

## 2016-03-31 MED FILL — GABAPENTIN 100 MG CAPSULE: 100 | 30 days supply | Qty: 180 | Fill #0

## 2016-06-24 ENCOUNTER — Ambulatory Visit: Payer: Self-pay | Admitting: Internal Medicine

## 2016-07-22 ENCOUNTER — Encounter (HOSPITAL_COMMUNITY): Payer: Self-pay | Admitting: *Deleted

## 2016-07-22 ENCOUNTER — Ambulatory Visit (HOSPITAL_COMMUNITY)
Admission: EM | Admit: 2016-07-22 | Discharge: 2016-07-22 | Disposition: A | Discharge status: Home / Self Care | Payer: Medicare Other | Attending: Family Medicine | Admitting: Family Medicine

## 2016-07-22 DIAGNOSIS — M542 Cervicalgia: Secondary | ICD-10-CM

## 2016-07-22 MED ORDER — IBUPROFEN 800 MG PO TABS: 800.0000 mg | ORAL_TABLET | Freq: Three times a day (TID) | ORAL | 0 refills | Status: DC
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
## 2016-11-20 ENCOUNTER — Emergency Department (HOSPITAL_COMMUNITY): Payer: Medicare Other

## 2016-11-20 ENCOUNTER — Emergency Department (HOSPITAL_COMMUNITY)
Admission: EM | Admit: 2016-11-20 | Discharge: 2016-11-20 | Disposition: A | Discharge status: Home / Self Care | Payer: Medicare Other | Attending: Emergency Medicine | Admitting: Emergency Medicine

## 2016-11-20 ENCOUNTER — Encounter (HOSPITAL_COMMUNITY): Payer: Self-pay | Admitting: Emergency Medicine

## 2016-11-20 DIAGNOSIS — F17210 Nicotine dependence, cigarettes, uncomplicated: Secondary | ICD-10-CM | POA: Insufficient documentation

## 2016-11-20 DIAGNOSIS — R51 Headache: Secondary | ICD-10-CM | POA: Insufficient documentation

## 2016-11-20 DIAGNOSIS — Z79899 Other long term (current) drug therapy: Secondary | ICD-10-CM | POA: Diagnosis not present

## 2016-11-20 DIAGNOSIS — R519 Headache, unspecified: Secondary | ICD-10-CM

## 2016-11-20 DIAGNOSIS — I10 Essential (primary) hypertension: Secondary | ICD-10-CM | POA: Diagnosis not present

## 2016-11-20 LAB — I-STAT TROPONIN, ED: Troponin i, poc: 0 ng/mL (ref 0.00–0.08)

## 2016-11-20 LAB — CBC
HCT: 43.1 % (ref 39.0–52.0)
Hemoglobin: 14.4 g/dL (ref 13.0–17.0)
MCH: 29.7 pg (ref 26.0–34.0)
MCHC: 33.4 g/dL (ref 30.0–36.0)
MCV: 88.9 fL (ref 78.0–100.0)
PLATELETS: 224 10*3/uL (ref 150–400)
RBC: 4.85 MIL/uL (ref 4.22–5.81)
RDW: 14.1 % (ref 11.5–15.5)
WBC: 9.2 10*3/uL (ref 4.0–10.5)

## 2016-11-20 LAB — BASIC METABOLIC PANEL
Anion gap: 10 (ref 5–15)
BUN: 11 mg/dL (ref 6–20)
CALCIUM: 8.9 mg/dL (ref 8.9–10.3)
CO2: 24 mmol/L (ref 22–32)
CREATININE: 1.03 mg/dL (ref 0.61–1.24)
Chloride: 104 mmol/L (ref 101–111)
GFR calc Af Amer: >60 mL/min (ref >60)
Glucose, Bld: 135 mg/dL — ABNORMAL HIGH (ref 65–99)
Potassium: 3.8 mmol/L (ref 3.5–5.1)
SODIUM: 138 mmol/L (ref 135–145)

## 2016-11-20 MED ORDER — PROCHLORPERAZINE EDISYLATE 5 MG/ML IJ SOLN
10.0000 mg | Freq: Once | INTRAMUSCULAR | Status: AC
  Administered 2016-11-20: 10 mg via INTRAVENOUS
  Filled 2016-11-20: qty 2

## 2016-11-20 MED ORDER — DIPHENHYDRAMINE HCL 50 MG/ML IJ SOLN
25.0000 mg | Freq: Once | INTRAMUSCULAR | Status: AC
  Administered 2016-11-20: 25 mg via INTRAVENOUS
  Filled 2016-11-20: qty 1

## 2016-11-20 MED ORDER — KETOROLAC TROMETHAMINE 30 MG/ML IJ SOLN
30.0000 mg | Freq: Once | INTRAMUSCULAR | Status: AC
  Administered 2016-11-20: 30 mg via INTRAVENOUS
  Filled 2016-11-20: qty 1

## 2016-11-20 NOTE — ED Triage Notes (Signed)
Pt brought to ED by [facility] from home for c/o left side cp that last for 2 hours, HA, neck and shoulder pain, pt was traveling whole day yesterday from [place], some increase stress lately. VS BP 140/74, R-20, HR 70, SPO2 95% on RA.

## 2016-11-20 NOTE — ED Provider Notes (Signed)
[facility] DEPT Provider Note   CSN: [id number] Arrival date & time: 11/20/16  0050 By signing my name below, I, Justin [name], attest that this documentation has been prepared under the direction and in the presence of Orpah Greek, MD . Electronically Signed: Dyke [name], Scribe. 11/20/2016. 1:11 AM.   History   Chief Complaint Chief Complaint  Patient presents with  . Headache   HPI Justin [name] is a 61 y.o. male with a history of HTN and lumbosacral spondylosis without myelopathy who presents to the Emergency Department complaining of occipital headache that radiates to his neck and back onset at 2 AM yesterday. He notes increased back pain with deep inspiration. Pt also expresses some chest pain which has since resolved. No prior treatments indicated. Per pt, he drove back to [place] from [facility].  No family hx of brain aneurysm, but pt's father had hx of brain tumor. Pt denies any SOB and has no other complaints or symptoms at this time.   The history is provided by the patient. No language interpreter was used.   Past Medical History:  Diagnosis Date  . Arthritis   . Hypertension    no meds    Patient Active Problem List   Diagnosis Date Noted  . Tobacco abuse 03/03/2016  . Lumbosacral spondylosis without myelopathy 03/08/2014  . Lumbar post-laminectomy syndrome 03/08/2014  . Dyslipidemia 06/06/2013  . Obesity (BMI 30-39.9) 06/06/2013  . Low back pain 11/17/2012  . Lumbago 11/17/2012    Past Surgical History:  Procedure Laterality Date  . BACK SURGERY  06/24/2004  . TONSILLECTOMY    . UMBILICAL HERNIA REPAIR      Home Medications    Prior to Admission medications   Medication Sig Start Date End Date Taking? Authorizing Provider  cyclobenzaprine (FLEXERIL) 10 MG tablet Take 1 tablet (10 mg total) by mouth 3 (three) times daily as needed for muscle spasms. 03/03/16  Yes Dawn Lazarus Gowda, MD  methocarbamol (ROBAXIN) 500 MG tablet Take 1  tablet (500 mg total) by mouth every 6 (six) hours as needed for muscle spasms. 03/08/14  Yes Meredith Staggers, MD    Family History Family History  Problem Relation Age of Onset  . Colon cancer Father   . Heart disease Father   . Heart disease Mother     Social History Social History  Substance Use Topics  . Smoking status: Current Every Day Smoker    Packs/day: 0.50    Types: Cigarettes  . Smokeless tobacco: Never Used  . Alcohol use No    Allergies   Patient has no known allergies.  Review of Systems Review of Systems 10 systems reviewed and all are negative for acute change except as noted in the HPI.  Physical Exam Updated Vital Signs BP 132/73   Pulse 63   Temp 98.1 F (36.7 C) (Oral)   Resp 18   SpO2 97%   Physical Exam  Constitutional: He is oriented to person, place, and time. He appears well-developed and well-nourished. No distress.  HENT:  Head: Normocephalic and atraumatic.  Right Ear: Hearing normal.  Left Ear: Hearing normal.  Nose: Nose normal.  Mouth/Throat: Oropharynx is clear and moist and mucous membranes are normal.  Eyes: Conjunctivae and EOM are normal. Pupils are equal, round, and reactive to light.  Neck: Normal range of motion. Neck supple.  Cardiovascular: Regular rhythm, S1 normal and S2 normal.  Exam reveals no gallop and no friction rub.   No murmur  heard. Pulmonary/Chest: Effort normal and breath sounds normal. No respiratory distress. He exhibits no tenderness.  Abdominal: Soft. Normal appearance and bowel sounds are normal. There is no hepatosplenomegaly. There is no tenderness. There is no rebound, no guarding, no tenderness at McBurney's point and negative Murphy's sign. No hernia.  Musculoskeletal: Normal range of motion.  Neurological: He is alert and oriented to person, place, and time. He has normal strength. No cranial nerve deficit or sensory deficit. Coordination normal. GCS eye subscore is 4. GCS verbal subscore is 5. GCS  motor subscore is 6.  Skin: Skin is warm, dry and intact. No rash noted. No cyanosis.  Psychiatric: He has a normal mood and affect. His speech is normal and behavior is normal. Thought content normal.  Nursing note and vitals reviewed.  ED Treatments / Results  DIAGNOSTIC STUDIES:  Oxygen Saturation is 98% on RA, normal by my interpretation.    COORDINATION OF CARE:  1:10 AM Will order CT head and DG chest. Discussed treatment plan with pt at bedside and pt agreed to plan.   Labs (all labs ordered are listed, but only abnormal results are displayed) Labs Reviewed  BASIC METABOLIC PANEL - Abnormal; Notable for the following:       Result Value   Glucose, Bld 135 (*)    All other components within normal limits  CBC  I-STAT TROPOININ, ED    EKG  EKG Interpretation  Date/Time:  Thursday November 20 2016 00:57:43 EDT Ventricular Rate:  70 PR Interval:    QRS Duration: 89 QT Interval:  391 QTC Calculation: 422 R Axis:   68 Text Interpretation:  Sinus rhythm Normal ECG Confirmed by POLLINA  MD, CHRISTOPHER [phone]) on 11/20/2016 1:01:07 AM      Radiology Dg Chest 2 View  Result Date: 11/20/2016 CLINICAL DATA:  Acute onset of headache, extending to the shoulders. Initial encounter. EXAM: CHEST  2 VIEW COMPARISON:  Chest radiograph from 06/19/2004 FINDINGS: The lungs are well-aerated. Calcified left hilar and mediastinal nodes are again seen. There is no evidence of focal opacification, pleural effusion or pneumothorax. The heart is normal in size; the mediastinal contour is within normal limits. No acute osseous abnormalities are seen. IMPRESSION: No acute cardiopulmonary process seen. Electronically Signed   By: Garald Balding M.D.   On: 11/20/2016 02:04   Ct Head Wo Contrast  Result Date: 11/20/2016 CLINICAL DATA:  Acute onset of headache.  Initial encounter. EXAM: CT HEAD WITHOUT CONTRAST TECHNIQUE: Contiguous axial images were obtained from the base of the skull through the  vertex without intravenous contrast. COMPARISON:  None. FINDINGS: Brain: No evidence of acute infarction, hemorrhage, hydrocephalus, extra-axial collection or mass lesion/mass effect. The posterior fossa, including the cerebellum, brainstem and fourth ventricle, is within normal limits. The third and lateral ventricles, and basal ganglia are unremarkable in appearance. The cerebral hemispheres are symmetric in appearance, with normal gray-white differentiation. No mass effect or midline shift is seen. Vascular: No hyperdense vessel or unexpected calcification. Skull: There is no evidence of fracture; visualized osseous structures are unremarkable in appearance. Sinuses/Orbits: The orbits are within normal limits. The paranasal sinuses and mastoid air cells are well-aerated. Other: No significant soft tissue abnormalities are seen. IMPRESSION: Unremarkable noncontrast CT of the head. Electronically Signed   By: Garald Balding M.D.   On: 11/20/2016 02:05    Procedures Procedures (including critical care time)  Medications Ordered in ED Medications  ketorolac (TORADOL) 30 MG/ML injection 30 mg (30 mg Intravenous Given 11/20/16 0407)  prochlorperazine (COMPAZINE) injection 10 mg (10 mg Intravenous Given 11/20/16 0407)  diphenhydrAMINE (BENADRYL) injection 25 mg (25 mg Intravenous Given 11/20/16 0407)     Initial Impression / Assessment and Plan / ED Course  I have reviewed the triage vital signs and the nursing notes.  Pertinent labs & imaging results that were available during my care of the patient were reviewed by me and considered in my medical decision making (see chart for details).     Patient presents with complaints of headache, neck pain, bilateral shoulder pain, chest pain. He reports that he has had a lot of increased stress lately. At arrival to the ER he is complaining of persistent posterior occipital headache. No further chest pain. Patient's symptoms are very atypical for cardiac  etiology. EKG, troponin unremarkable. Patient underwent CT of head, also unremarkable. Patient treated with Compazine, Benadryl, Toradol with significant improvement of his headache.  Final Clinical Impressions(s) / ED Diagnoses   Final diagnoses:  Bad headache   New Prescriptions New Prescriptions   No medications on file  I personally performed the services described in this documentation, which was scribed in my presence. The recorded information has been reviewed and is accurate.    Orpah Greek, MD 11/20/16 [phone]

## 2016-12-17 ENCOUNTER — Ambulatory Visit: Payer: Self-pay | Admitting: Family Medicine

## 2016-12-24 ENCOUNTER — Ambulatory Visit: Payer: Medicare Other | Admitting: Family Medicine

## 2016-12-26 ENCOUNTER — Ambulatory Visit (INDEPENDENT_AMBULATORY_CARE_PROVIDER_SITE_OTHER): Payer: Medicare Other | Admitting: Family Medicine

## 2016-12-26 ENCOUNTER — Encounter: Payer: Self-pay | Admitting: Family Medicine

## 2016-12-26 DIAGNOSIS — R7303 Prediabetes: Secondary | ICD-10-CM | POA: Diagnosis not present

## 2016-12-26 DIAGNOSIS — G8929 Other chronic pain: Secondary | ICD-10-CM | POA: Diagnosis not present

## 2016-12-26 DIAGNOSIS — E668 Other obesity: Secondary | ICD-10-CM | POA: Diagnosis not present

## 2016-12-26 DIAGNOSIS — F17200 Nicotine dependence, unspecified, uncomplicated: Secondary | ICD-10-CM | POA: Diagnosis not present

## 2016-12-26 DIAGNOSIS — I10 Essential (primary) hypertension: Secondary | ICD-10-CM | POA: Diagnosis not present

## 2016-12-26 DIAGNOSIS — M5441 Lumbago with sciatica, right side: Secondary | ICD-10-CM

## 2016-12-26 DIAGNOSIS — F17203 Nicotine dependence unspecified, with withdrawal: Secondary | ICD-10-CM

## 2016-12-26 LAB — POCT CBC
Granulocyte percent: 45.1 %G (ref 37–80)
HEMATOCRIT: 41.5 % — AB (ref 43.5–53.7)
Hemoglobin: 13.6 g/dL — AB (ref 14.1–18.1)
Lymph, poc: 2.9 (ref 0.6–3.4)
MCH: 29.7 pg (ref 27–31.2)
MCHC: 32.9 g/dL (ref 31.8–35.4)
MCV: 90.3 fL (ref 80–97)
MID (CBC): 0.4 (ref 0–0.9)
MPV: 8.4 fL (ref 0–99.8)
POC GRANULOCYTE: 2.8 (ref 2–6.9)
POC LYMPH PERCENT: 47.7 %L (ref 10–50)
POC MID %: 7.2 % (ref 0–12)
Platelet Count, POC: 166 10*3/uL (ref 142–424)
RBC: 4.59 M/uL — AB (ref 4.69–6.13)
RDW, POC: 14.7 %
WBC: 6.1 10*3/uL (ref 4.6–10.2)

## 2016-12-26 LAB — POCT URINALYSIS DIP (MANUAL ENTRY)
BILIRUBIN UA: NEGATIVE
GLUCOSE UA: NEGATIVE mg/dL
Ketones, POC UA: NEGATIVE mg/dL
Leukocytes, UA: NEGATIVE
NITRITE UA: NEGATIVE
Protein Ur, POC: NEGATIVE mg/dL
RBC UA: NEGATIVE
Spec Grav, UA: 1.02 (ref 1.010–1.025)
Urobilinogen, UA: 0.2 E.U./dL
pH, UA: 5 (ref 5.0–8.0)

## 2016-12-26 LAB — POCT GLYCOSYLATED HEMOGLOBIN (HGB A1C): HEMOGLOBIN A1C: 6.1

## 2016-12-26 NOTE — Patient Instructions (Addendum)
  Meredith Staggers, MD Consulting Physician Physical Medicine and Rehabilitation      Phone: [phone];   Fax: [phone]        IF you received an x-ray today, you will receive an invoice from [facility] Radiology. Please contact [facility] Radiology at [phone] with questions or concerns regarding your invoice.   IF you received labwork today, you will receive an invoice from [facility]. Please contact [facility] at [phone] with questions or concerns regarding your invoice.   Our billing staff will not be able to assist you with questions regarding bills from these companies.  You will be contacted with the lab results as soon as they are available. The fastest way to get your results is to activate your My Chart account. Instructions are located on the last page of this paperwork. If you have not heard from [place] regarding the results in 2 weeks, please contact this office.
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
## 2016-12-27 ENCOUNTER — Encounter: Payer: Self-pay | Admitting: Family Medicine

## 2016-12-27 DIAGNOSIS — I10 Essential (primary) hypertension: Secondary | ICD-10-CM | POA: Insufficient documentation

## 2016-12-27 LAB — BASIC METABOLIC PANEL
BUN/Creatinine Ratio: 19 (ref 10–24)
BUN: 16 mg/dL (ref 8–27)
CALCIUM: 9.2 mg/dL (ref 8.6–10.2)
CHLORIDE: 101 mmol/L (ref 96–106)
CO2: 23 mmol/L (ref 18–29)
Creatinine, Ser: 0.85 mg/dL (ref 0.76–1.27)
GFR calc Af Amer: 108 mL/min/{1.73_m2} (ref >59)
GFR, EST NON AFRICAN AMERICAN: 93 mL/min/{1.73_m2} (ref >59)
GLUCOSE: 104 mg/dL — AB (ref 65–99)
POTASSIUM: 4.3 mmol/L (ref 3.5–5.2)
Sodium: 139 mmol/L (ref 134–144)

## 2017-02-18 ENCOUNTER — Other Ambulatory Visit: Payer: Self-pay

## 2017-02-18 ENCOUNTER — Ambulatory Visit: Payer: Medicare Other | Attending: Family Medicine | Admitting: Family Medicine

## 2017-02-18 VITALS — BP 126/77 | HR 67 | Temp 97.4°F | Resp 18 | Ht 71.0 in | Wt 267.2 lb

## 2017-02-18 DIAGNOSIS — M792 Neuralgia and neuritis, unspecified: Secondary | ICD-10-CM | POA: Diagnosis not present

## 2017-02-18 DIAGNOSIS — R0609 Other forms of dyspnea: Secondary | ICD-10-CM

## 2017-02-18 DIAGNOSIS — E785 Hyperlipidemia, unspecified: Secondary | ICD-10-CM | POA: Diagnosis not present

## 2017-02-18 DIAGNOSIS — R7303 Prediabetes: Secondary | ICD-10-CM | POA: Insufficient documentation

## 2017-02-18 DIAGNOSIS — M542 Cervicalgia: Secondary | ICD-10-CM

## 2017-02-18 DIAGNOSIS — Z87898 Personal history of other specified conditions: Secondary | ICD-10-CM | POA: Insufficient documentation

## 2017-02-18 DIAGNOSIS — G8929 Other chronic pain: Secondary | ICD-10-CM | POA: Diagnosis not present

## 2017-02-18 DIAGNOSIS — I10 Essential (primary) hypertension: Secondary | ICD-10-CM | POA: Diagnosis not present

## 2017-02-18 LAB — GLUCOSE, POCT (MANUAL RESULT ENTRY): POC Glucose: 112 mg/dl — AB (ref 70–99)

## 2017-02-18 MED ORDER — IBUPROFEN 600 MG PO TABS: 600.0000 mg | ORAL_TABLET | Freq: Three times a day (TID) | ORAL | 0 refills | Status: DC | PRN

## 2017-02-18 NOTE — Progress Notes (Signed)
Patient is here for establish care  Patient is here for HTN

## 2017-02-18 NOTE — Patient Instructions (Signed)
Cervical Radiculopathy  Cervical radiculopathy means that a nerve in the neck is pinched or bruised. This can cause pain or loss of feeling (numbness) that runs from your neck to your arm and fingers.  Follow these instructions at home:  Managing pain  ? Take over-the-counter and prescription medicines only as told by your doctor.  ? If directed, put ice on the injured or painful area.  ? Put ice in a plastic bag.  ? Place a towel between your skin and the bag.  ? Leave the ice on for 20 minutes, 2?3 times per day.  ? If ice does not help, you can try using heat. Take a warm shower or warm bath, or use a heat pack as told by your doctor.  ? You may try a gentle neck and shoulder massage.  Activity  ? Rest as needed. Follow instructions from your doctor about any activities to avoid.  ? Do exercises as told by your doctor or physical therapist.  General instructions  ? If you were given a soft collar, wear it as told by your doctor.  ? Use a flat pillow when you sleep.  ? Keep all follow-up visits as told by your doctor. This is important.  Contact a doctor if:  ? Your condition does not improve with treatment.  Get help right away if:  ? Your pain gets worse and is not controlled with medicine.  ? You lose feeling or feel weak in your hand, arm, face, or leg.  ? You have a fever.  ? You have a stiff neck.  ? You cannot control when you poop or pee (have incontinence).  ? You have trouble with walking, balance, or talking.  This information is not intended to replace advice given to you by your health care provider. Make sure you discuss any questions you have with your health care provider.  Document Released: 07/31/2011 Document Revised: 01/17/2016 Document Reviewed: 10/05/2014  Elsevier Interactive Patient Education ? 2018 [employer]

## 2017-02-18 NOTE — Progress Notes (Signed)
Subjective:  Patient ID: Justin [name], male    DOB: 1956-03-24  Age: 61 y.o. MRN: [id number]  CC: Hypertension   HPI Justin [name] presents for follow up. PMH of HTN, DM, HLD, prediabetes, and arthritis. Patient denies foot ulcerations, hyperglycemia, nausea, paresthesia of the feet, polydipsia, polyuria, visual disturbances and vomitting.  Evaluation to date has been included: fasting blood sugar, fasting lipid panel and hemoglobin A1C.  He is not exercising and is not adherent to low salt diet. He does not take BP at home. Cardiac symptoms He reports occasional episodes of dyspnea and CP with exertion for 2 months. Cardiovascular risk factors: advanced age (older than 50 for men, 81 for women), dyslipidemia, hypertension, male gender, obesity (BMI >= 30 kg/m2), sedentary lifestyle, smoking/ tobacco exposure and family history of cardiaovascular disease. He reports family history of heart disease: mother- heart failure, father- MI, brother- CVA. Use of agents associated with hypertension: none.History of target organ damage: none.  Patient complains of arthralgias for which has been present for 2 months. Pain is located in the left arm, is described as aching, and is intermittent .  Associated symptoms include: parathesias.  The patient has tried NSAIDs for pain, with moderate relief.     No outpatient prescriptions prior to visit.   No facility-administered medications prior to visit.     ROS Review of Systems  Constitutional: Negative.   Eyes: Negative.   Respiratory: Positive for shortness of breath (dyspnea with exertion for 2 mos.).   Cardiovascular: Positive for chest pain (with exertion for 2 mos.).  Gastrointestinal: Negative.   Endocrine: Negative.   Musculoskeletal: Negative.   Skin: Negative.    Objective:  BP 126/77 (BP Location: Left Arm, Patient Position: Sitting, Cuff Size: Normal)   Pulse 67   Temp 97.4 F (36.3 C) (Oral)   Resp 18   Ht 5\' 11"  (1.803 m)   Wt 267  lb 3.2 oz (121.2 kg)   SpO2 94%   BMI 37.27 kg/m   BP/Weight 03/03/2017 0/35/0093 03/25/8298  Systolic BP 371 696 789  Diastolic BP 81 77 71  Wt. (Lbs) 267 267.2 251.8  BMI 37.24 37.27 35.12   Physical Exam  Constitutional: He is oriented to person, place, and time. He appears well-developed and well-nourished.  Eyes: Conjunctivae are normal. Pupils are equal, round, and reactive to light.  Neck: Normal range of motion. Neck supple. No JVD present.  Cardiovascular: Normal rate, regular rhythm, normal heart sounds and intact distal pulses.   Pulmonary/Chest: Effort normal and breath sounds normal.  Abdominal: Soft. Bowel sounds are normal.  Musculoskeletal: Normal range of motion.       Cervical back: He exhibits pain.       Left upper arm: He exhibits no tenderness and no swelling.  Left arm pain with active ROM.   Neurological: He is alert and oriented to person, place, and time. He has normal reflexes.  Skin: Skin is warm and dry.  Psychiatric: He has a normal mood and affect.  Nursing note and vitals reviewed.  Assessment & Plan:   Problem List Items Addressed This Visit      Cardiovascular and Mediastinum   Essential hypertension - Primary   Relevant Orders   Lipid Panel (Completed)   POCT UA - Microalbumin   EKG 12-Lead     Other   Dyslipidemia    Other Visit Diagnoses    Chronic neck pain       Relevant Medications   ibuprofen (  ADVIL,MOTRIN) 600 MG tablet   Radicular pain of left upper extremity       Relevant Medications   ibuprofen (ADVIL,MOTRIN) 600 MG tablet   Other Relevant Orders   DG Cervical Spine Complete   History of exertional chest pain       Relevant Orders   EKG 12-Lead   Ambulatory referral to Cardiology   CBC with Differential (Completed)   Basic Metabolic Panel (Completed)   DG Chest 2 View   Dyspnea on exertion       Relevant Orders   EKG 12-Lead   Ambulatory referral to Cardiology   CBC with Differential (Completed)   Basic Metabolic  Panel (Completed)   DG Chest 2 View   History of prediabetes       Incorporate diet and exercise changes for now.   Re-evaluate in 3 months   Relevant Orders   Glucose (CBG) (Completed)      Meds ordered this encounter  Medications  . ibuprofen (ADVIL,MOTRIN) 600 MG tablet    Sig: Take 1 tablet (600 mg total) by mouth every 8 (eight) hours as needed.    Dispense:  30 tablet    Refill:  0    Order Specific Question:   Supervising Provider    Answer:   Tresa Garter [[id number]]    Follow-up: Return in about 2 weeks (around 03/04/2017) for Physical .   Alfonse Spruce FNP

## 2017-02-19 LAB — CBC WITH DIFFERENTIAL/PLATELET
Basophils Absolute: 0.1 10*3/uL (ref 0.0–0.2)
Basos: 1 %
EOS (ABSOLUTE): 0.1 10*3/uL (ref 0.0–0.4)
EOS: 2 %
HEMATOCRIT: 44.6 % (ref 37.5–51.0)
HEMOGLOBIN: 14.5 g/dL (ref 13.0–17.7)
IMMATURE GRANULOCYTES: 1 %
Immature Grans (Abs): 0 10*3/uL (ref 0.0–0.1)
Lymphocytes Absolute: 3.6 10*3/uL — ABNORMAL HIGH (ref 0.7–3.1)
Lymphs: 54 %
MCH: 29.8 pg (ref 26.6–33.0)
MCHC: 32.5 g/dL (ref 31.5–35.7)
MCV: 92 fL (ref 79–97)
MONOCYTES: 10 %
MONOS ABS: 0.6 10*3/uL (ref 0.1–0.9)
NEUTROS PCT: 32 %
Neutrophils Absolute: 2.1 10*3/uL (ref 1.4–7.0)
Platelets: 228 10*3/uL (ref 150–379)
RBC: 4.86 x10E6/uL (ref 4.14–5.80)
RDW: 14.5 % (ref 12.3–15.4)
WBC: 6.6 10*3/uL (ref 3.4–10.8)

## 2017-02-19 LAB — BASIC METABOLIC PANEL
BUN/Creatinine Ratio: 14 (ref 10–24)
BUN: 14 mg/dL (ref 8–27)
CALCIUM: 9.5 mg/dL (ref 8.6–10.2)
CHLORIDE: 105 mmol/L (ref 96–106)
CO2: 19 mmol/L — AB (ref 20–29)
Creatinine, Ser: 0.98 mg/dL (ref 0.76–1.27)
GFR calc Af Amer: 94 mL/min/{1.73_m2} (ref >59)
GFR, EST NON AFRICAN AMERICAN: 82 mL/min/{1.73_m2} (ref >59)
GLUCOSE: 106 mg/dL — AB (ref 65–99)
POTASSIUM: 4.4 mmol/L (ref 3.5–5.2)
Sodium: 141 mmol/L (ref 134–144)

## 2017-02-19 LAB — LIPID PANEL
Chol/HDL Ratio: 3.8 ratio (ref 0.0–5.0)
Cholesterol, Total: 214 mg/dL — ABNORMAL HIGH (ref 100–199)
HDL: 56 mg/dL (ref >39)
LDL Calculated: 135 mg/dL — ABNORMAL HIGH (ref 0–99)
Triglycerides: 115 mg/dL (ref 0–149)
VLDL Cholesterol Cal: 23 mg/dL (ref 5–40)

## 2017-02-23 ENCOUNTER — Telehealth: Payer: Self-pay

## 2017-02-23 ENCOUNTER — Other Ambulatory Visit: Payer: Self-pay | Admitting: Family Medicine

## 2017-02-23 DIAGNOSIS — E782 Mixed hyperlipidemia: Secondary | ICD-10-CM | POA: Insufficient documentation

## 2017-02-23 MED ORDER — ATORVASTATIN CALCIUM 20 MG PO TABS: 20.0000 mg | ORAL_TABLET | Freq: Every day | ORAL | 2 refills | Status: DC

## 2017-02-23 MED FILL — ?ATORVASTATIN 20 MG TABLET: 20 | 30 days supply | Qty: 30 | Fill #0

## 2017-02-23 NOTE — Telephone Encounter (Signed)
CMA call regarding results  Patient verify DOB  Patient was aware and understood  

## 2017-02-23 NOTE — Telephone Encounter (Signed)
-----   Message from Alfonse Spruce, [place] sent at 02/23/2017  9:34 AM EDT ----- Labs that evaluated your blood cells, fluid and electrolyte balance are normal. No signs of anemia, acute infection, or inflammation present. Kidney function normal -Lipid levels were elevated. This can increase your risk of heart disease. You will be prescribed atorvastatin to help lower risk. Recommend follow up in 3 months.

## 2017-03-03 ENCOUNTER — Emergency Department (HOSPITAL_COMMUNITY): Payer: Medicare Other

## 2017-03-03 ENCOUNTER — Encounter (HOSPITAL_COMMUNITY): Payer: Self-pay | Admitting: Vascular Surgery

## 2017-03-03 ENCOUNTER — Emergency Department (HOSPITAL_COMMUNITY)
Admission: EM | Admit: 2017-03-03 | Discharge: 2017-03-03 | Discharge status: Against Medical Advice | Payer: Medicare Other | Attending: Emergency Medicine | Admitting: Emergency Medicine

## 2017-03-03 DIAGNOSIS — M545 Low back pain: Secondary | ICD-10-CM | POA: Diagnosis not present

## 2017-03-03 DIAGNOSIS — Z5321 Procedure and treatment not carried out due to patient leaving prior to being seen by health care provider: Secondary | ICD-10-CM | POA: Diagnosis not present

## 2017-03-03 DIAGNOSIS — M549 Dorsalgia, unspecified: Secondary | ICD-10-CM | POA: Diagnosis not present

## 2017-03-03 HISTORY — DX: Pure hypercholesterolemia, unspecified: E78.00

## 2017-03-03 NOTE — ED Notes (Signed)
At desk stating I've got to go.  I just came to have an xray done.  Encouraged to stay without success.  Encouraged to return if needed.

## 2017-03-03 NOTE — ED Triage Notes (Signed)
Pt reports to the ED for eval of chest X-ray and a spine test. Pt was just seen at the [facility] and was seen for back pain and his chronic medical problems. He had blood tests, EKG, and urine tests done on his previous visit and he was seen for a repeat f/u visit and was told everything was normal and that he has high cholesterol. They prescribed him some medication for his back and told him to come here for a chest X-ray and something test for his spine. Pt is unable to specify what tests other than the chest X-ray. Denies any CP or SOB.

## 2017-03-12 ENCOUNTER — Ambulatory Visit: Payer: Medicare Other | Attending: Family Medicine | Admitting: Family Medicine

## 2017-03-12 ENCOUNTER — Encounter: Payer: Self-pay | Admitting: Family Medicine

## 2017-03-12 VITALS — BP 123/74 | HR 72 | Temp 98.7°F | Resp 19 | Ht 71.0 in | Wt 256.4 lb

## 2017-03-12 DIAGNOSIS — H547 Unspecified visual loss: Secondary | ICD-10-CM | POA: Diagnosis not present

## 2017-03-12 DIAGNOSIS — Z114 Encounter for screening for human immunodeficiency virus [HIV]: Secondary | ICD-10-CM | POA: Diagnosis not present

## 2017-03-12 DIAGNOSIS — M1990 Unspecified osteoarthritis, unspecified site: Secondary | ICD-10-CM | POA: Insufficient documentation

## 2017-03-12 DIAGNOSIS — Z9889 Other specified postprocedural states: Secondary | ICD-10-CM | POA: Insufficient documentation

## 2017-03-12 DIAGNOSIS — G8929 Other chronic pain: Secondary | ICD-10-CM | POA: Diagnosis not present

## 2017-03-12 DIAGNOSIS — E7800 Pure hypercholesterolemia, unspecified: Secondary | ICD-10-CM | POA: Diagnosis not present

## 2017-03-12 DIAGNOSIS — Z8249 Family history of ischemic heart disease and other diseases of the circulatory system: Secondary | ICD-10-CM | POA: Diagnosis not present

## 2017-03-12 DIAGNOSIS — Z8371 Family history of colonic polyps: Secondary | ICD-10-CM | POA: Diagnosis not present

## 2017-03-12 DIAGNOSIS — M5410 Radiculopathy, site unspecified: Secondary | ICD-10-CM | POA: Diagnosis not present

## 2017-03-12 DIAGNOSIS — Z0000 Encounter for general adult medical examination without abnormal findings: Secondary | ICD-10-CM | POA: Insufficient documentation

## 2017-03-12 DIAGNOSIS — K029 Dental caries, unspecified: Secondary | ICD-10-CM | POA: Diagnosis not present

## 2017-03-12 DIAGNOSIS — Z13818 Encounter for screening for other digestive system disorders: Secondary | ICD-10-CM | POA: Insufficient documentation

## 2017-03-12 DIAGNOSIS — Z823 Family history of stroke: Secondary | ICD-10-CM | POA: Insufficient documentation

## 2017-03-12 DIAGNOSIS — I10 Essential (primary) hypertension: Secondary | ICD-10-CM | POA: Diagnosis not present

## 2017-03-12 DIAGNOSIS — M545 Low back pain: Secondary | ICD-10-CM | POA: Insufficient documentation

## 2017-03-12 DIAGNOSIS — M792 Neuralgia and neuritis, unspecified: Secondary | ICD-10-CM | POA: Diagnosis not present

## 2017-03-12 DIAGNOSIS — F17210 Nicotine dependence, cigarettes, uncomplicated: Secondary | ICD-10-CM | POA: Diagnosis not present

## 2017-03-12 LAB — POCT GLYCOSYLATED HEMOGLOBIN (HGB A1C): Hemoglobin A1C: 5.7

## 2017-03-12 MED ORDER — IBUPROFEN 600 MG PO TABS: 600.0000 mg | ORAL_TABLET | Freq: Three times a day (TID) | ORAL | 0 refills | Status: DC | PRN

## 2017-03-12 MED ORDER — CYCLOBENZAPRINE HCL 5 MG PO TABS: 5.0000 mg | ORAL_TABLET | Freq: Three times a day (TID) | ORAL | 0 refills | Status: DC | PRN

## 2017-03-12 NOTE — Progress Notes (Signed)
Subjective:   Patient ID: Justin [name], male    DOB: [birth year], 61 y.o.   MRN: [id number]  Chief Complaint  Patient presents with  . Annual Exam   HPI Justin [name] 61 y.o. male presents with annual physical examination. Denies any CP, SOB, syncope, or claudication symptoms. History of HLD he reports starting his atorvastatin on Friday. He has family history of: CAD, DM, pacemaker -mother; MI, colon polyps- father; CVA- brother. Denies any family history cancer. History of chronic back pain with surgical intervention in 2005. Denies any worsening of symptoms, inability to bear weight, or paresthesias. Denies symptoms of all other pertinent systems.   Past Medical History:  Diagnosis Date  . Arthritis   . Hypercholesteremia   . Hypertension    no meds    Past Surgical History:  Procedure Laterality Date  . BACK SURGERY  06/24/2004  . TONSILLECTOMY    . UMBILICAL HERNIA REPAIR      Family History  Problem Relation Age of Onset  . Colon cancer Father   . Heart disease Father   . Heart disease Mother     Social History   Social History  . Marital status: Single    Spouse name: N/A  . Number of children: N/A  . Years of education: N/A   Occupational History  . Not on file.   Social History Main Topics  . Smoking status: Current Every Day Smoker    Packs/day: 0.50    Types: Cigarettes  . Smokeless tobacco: Never Used  . Alcohol use No  . Drug use: No  . Sexual activity: Not on file   Other Topics Concern  . Not on file   Social History Narrative  . No narrative on file    Outpatient Medications Prior to Visit  Medication Sig Dispense Refill  . atorvastatin (LIPITOR) 20 MG tablet Take 1 tablet (20 mg total) by mouth daily. 30 tablet 2  . ibuprofen (ADVIL,MOTRIN) 600 MG tablet Take 1 tablet (600 mg total) by mouth every 8 (eight) hours as needed. 30 tablet 0   No facility-administered medications prior to visit.     No Known Allergies  Review of  Systems  Constitutional: Negative.   HENT: Negative.   Eyes: Negative.   Respiratory: Negative.   Cardiovascular: Negative.   Gastrointestinal: Negative.   Genitourinary: Negative.   Musculoskeletal: Positive for back pain (chronic) and myalgias.  Skin: Negative.   Neurological: Negative.   Endo/Heme/Allergies: Negative.   Psychiatric/Behavioral: Negative.  Negative for suicidal ideas.      Objective:    Physical Exam  Constitutional: He is oriented to person, place, and time. He appears well-developed and well-nourished.  HENT:  Head: Normocephalic and atraumatic.  Right Ear: External ear normal.  Left Ear: External ear normal.  Nose: Nose normal.  Mouth/Throat: Oropharynx is clear and moist.  Eyes: Pupils are equal, round, and reactive to light. Conjunctivae and EOM are normal.  Neck: Normal range of motion. Neck supple.  Cardiovascular: Normal rate, regular rhythm, normal heart sounds and intact distal pulses.   Pulmonary/Chest: Effort normal and breath sounds normal.  Abdominal: Soft. Bowel sounds are normal.  Genitourinary: Rectum normal, prostate normal and penis normal.  Musculoskeletal: Normal range of motion.       Right shoulder: He exhibits pain (and stffness to biltateral lower back with leg raise).  Neurological: He is alert and oriented to person, place, and time. He has normal reflexes.  Skin: Skin is warm and dry.  Psychiatric: He has a normal mood and affect. His behavior is normal. Judgment and thought content normal.  Nursing note and vitals reviewed.   BP 123/74 (BP Location: Left Arm, Patient Position: Sitting, Cuff Size: Large)   Pulse 72   Temp 98.7 F (37.1 C) (Oral)   Resp 19   Ht 5' 11"  (1.803 m)   Wt 256 lb 6.4 oz (116.3 kg)   SpO2 95%   BMI 35.76 kg/m  Wt Readings from Last 3 Encounters:  03/12/17 256 lb 6.4 oz (116.3 kg)  03/03/17 267 lb (121.1 kg)  02/18/17 267 lb 3.2 oz (121.2 kg)     There is no immunization history on file for  this patient.   No results found for: TSH Lab Results  Component Value Date   WBC 6.6 02/18/2017   HGB 14.5 02/18/2017   HCT 44.6 02/18/2017   MCV 92 02/18/2017   PLT 228 02/18/2017   Lab Results  Component Value Date   NA 141 02/18/2017   K 4.4 02/18/2017   CO2 19 (L) 02/18/2017   GLUCOSE 106 (H) 02/18/2017   BUN 14 02/18/2017   CREATININE 0.98 02/18/2017   BILITOT 0.3 11/16/2012   ALKPHOS 74 11/16/2012   AST 15 11/16/2012   ALT 21 11/16/2012   PROT 7.0 11/16/2012   ALBUMIN 3.6 11/16/2012   CALCIUM 9.5 02/18/2017   ANIONGAP 10 11/20/2016   Lab Results  Component Value Date   CHOL 214 (H) 02/18/2017   CHOL 202 (H) 03/03/2016   CHOL 195 02/23/2015   Lab Results  Component Value Date   HDL 56 02/18/2017   HDL 48 03/03/2016   HDL 36 (L) 02/23/2015   Lab Results  Component Value Date   LDLCALC 135 (H) 02/18/2017   LDLCALC 118 03/03/2016   LDLCALC 123 (H) 02/23/2015   Lab Results  Component Value Date   TRIG 115 02/18/2017   TRIG 178 (H) 03/03/2016   TRIG 181 (H) 02/23/2015   Lab Results  Component Value Date   CHOLHDL 3.8 02/18/2017   CHOLHDL 4.2 03/03/2016   CHOLHDL 5.4 02/23/2015   Lab Results  Component Value Date   HGBA1C 5.7 03/12/2017   HGBA1C 6.1 12/26/2016   HGBA1C 5.7 03/03/2016       Assessment & Plan:   Problem List Items Addressed This Visit      Other   Chronic bilateral low back pain without sciatica   Relevant Medications   ibuprofen (ADVIL,MOTRIN) 600 MG tablet   cyclobenzaprine (FLEXERIL) 5 MG tablet    Other Visit Diagnoses    Encounter for annual physical exam    -  Primary   Relevant Orders   CMP14+EGFR   CBC with Differential   TSH   Vitamin D, 25-hydroxy   POCT glycosylated hemoglobin (Hb A1C) (Completed)   PSA   Dental decay       Relevant Orders   Ambulatory referral to Dentistry   Healthcare maintenance       Relevant Orders   HIV antibody (with reflex)   Hepatitis C Antibody   Decreased visual acuity        Relevant Orders   Ambulatory referral to Ophthalmology   Radicular pain of left upper extremity          Meds ordered this encounter  Medications  . ibuprofen (ADVIL,MOTRIN) 600 MG tablet    Sig: Take 1 tablet (600 mg total) by mouth every 8 (eight) hours as needed.    Dispense:  30 tablet    Refill:  0    Order Specific Question:   Supervising Provider    Answer:   Tresa Garter [id number]  . cyclobenzaprine (FLEXERIL) 5 MG tablet    Sig: Take 1 tablet (5 mg total) by mouth every 8 (eight) hours as needed for muscle spasms.    Dispense:  30 tablet    Refill:  0    Order Specific Question:   Supervising Provider    Answer:   Tresa Garter [id number]   Follow up: Return in about 3 months (around 06/12/2017) for HLD/Pre-Diabetes.   Fredia Beets, FNP

## 2017-03-12 NOTE — Progress Notes (Signed)
Patient has had medication  Patient has not eaten

## 2017-03-12 NOTE — Patient Instructions (Signed)
Fat and Cholesterol Restricted Diet Getting too much fat and cholesterol in your diet may cause health problems. Following this diet helps keep your fat and cholesterol at normal levels. This can keep you from getting sick. What types of fat should I choose?  Choose monosaturated and polyunsaturated fats. These are found in foods such as olive oil, canola oil, flaxseeds, walnuts, almonds, and seeds.  Eat more omega-3 fats. Good choices include salmon, mackerel, sardines, tuna, flaxseed oil, and ground flaxseeds.  Limit saturated fats. These are in animal products such as meats, butter, and cream. They can also be in plant products such as palm oil, palm kernel oil, and coconut oil.  Avoid foods with partially hydrogenated oils in them. These contain trans fats. Examples of foods that have trans fats are stick margarine, some tub margarines, cookies, crackers, and other baked goods. What general guidelines do I need to follow?  Check food labels. Look for the words "trans fat" and "saturated fat."  When preparing a meal: ? Fill half of your plate with vegetables and green salads. ? Fill one fourth of your plate with whole grains. Look for the word "whole" as the first word in the ingredient list. ? Fill one fourth of your plate with lean protein foods.  Eat more foods that have fiber, like apples, carrots, beans, peas, and barley.  Eat more home-cooked foods. Eat less at restaurants and buffets.  Limit or avoid alcohol.  Limit foods high in starch and sugar.  Limit fried foods.  Cook foods without frying them. Baking, boiling, grilling, and broiling are all great options.  Lose weight if you are overweight. Losing even a small amount of weight can help your overall health. It can also help prevent diseases such as diabetes and heart disease. What foods can I eat? Grains Whole grains, such as whole wheat or whole grain breads, crackers, cereals, and pasta. Unsweetened oatmeal,  bulgur, barley, quinoa, or brown rice. Corn or whole wheat flour tortillas. Vegetables Fresh or frozen vegetables (raw, steamed, roasted, or grilled). Green salads. Fruits All fresh, canned (in natural juice), or frozen fruits. Meat and Other Protein Products Ground beef (85% or leaner), grass-fed beef, or beef trimmed of fat. Skinless chicken or turkey. Ground chicken or turkey. Pork trimmed of fat. All fish and seafood. Eggs. Dried beans, peas, or lentils. Unsalted nuts or seeds. Unsalted canned or dry beans. Dairy Low-fat dairy products, such as skim or 1% milk, 2% or reduced-fat cheeses, low-fat ricotta or cottage cheese, or plain low-fat yogurt. Fats and Oils Tub margarines without trans fats. Light or reduced-fat mayonnaise and salad dressings. Avocado. Olive, canola, sesame, or safflower oils. Natural peanut or almond butter (choose ones without added sugar and oil). The items listed above may not be a complete list of recommended foods or beverages. Contact your dietitian for more options. What foods are not recommended? Grains White bread. White pasta. White rice. Cornbread. Bagels, pastries, and croissants. Crackers that contain trans fat. Vegetables White potatoes. Corn. Creamed or fried vegetables. Vegetables in a cheese sauce. Fruits Dried fruits. Canned fruit in light or heavy syrup. Fruit juice. Meat and Other Protein Products Fatty cuts of meat. Ribs, chicken wings, bacon, sausage, bologna, salami, chitterlings, fatback, hot dogs, bratwurst, and packaged luncheon meats. Liver and organ meats. Dairy Whole or 2% milk, cream, half-and-half, and cream cheese. Whole milk cheeses. Whole-fat or sweetened yogurt. Full-fat cheeses. Nondairy creamers and whipped toppings. Processed cheese, cheese spreads, or cheese curds. Sweets and Desserts Corn   syrup, sugars, honey, and molasses. Candy. Jam and jelly. Syrup. Sweetened cereals. Cookies, pies, cakes, donuts, muffins, and ice  cream. Fats and Oils Butter, stick margarine, lard, shortening, ghee, or bacon fat. Coconut, palm kernel, or palm oils. Beverages Alcohol. Sweetened drinks (such as sodas, lemonade, and fruit drinks or punches). The items listed above may not be a complete list of foods and beverages to avoid. Contact your dietitian for more information. This information is not intended to replace advice given to you by your health care provider. Make sure you discuss any questions you have with your health care provider. Document Released: 02/10/2012 Document Revised: 04/17/2016 Document Reviewed: 11/10/2013 Elsevier Interactive Patient Education  2018 Elsevier Inc.  

## 2017-03-17 ENCOUNTER — Encounter: Payer: Self-pay | Admitting: Family Medicine

## 2017-04-03 DIAGNOSIS — H25813 Combined forms of age-related cataract, bilateral: Secondary | ICD-10-CM | POA: Diagnosis not present

## 2017-04-03 DIAGNOSIS — E119 Type 2 diabetes mellitus without complications: Secondary | ICD-10-CM | POA: Diagnosis not present

## 2017-05-07 ENCOUNTER — Encounter (HOSPITAL_BASED_OUTPATIENT_CLINIC_OR_DEPARTMENT_OTHER): Payer: Self-pay | Admitting: *Deleted

## 2017-05-13 ENCOUNTER — Ambulatory Visit (HOSPITAL_BASED_OUTPATIENT_CLINIC_OR_DEPARTMENT_OTHER): Admission: RE | Admit: 2017-05-13 | Payer: Medicare Other | Source: Ambulatory Visit | Admitting: Oral Surgery

## 2017-05-13 SURGERY — MULTIPLE EXTRACTION WITH ALVEOLOPLASTY
Anesthesia: General

## 2017-06-12 ENCOUNTER — Ambulatory Visit: Payer: Self-pay | Admitting: Family Medicine

## 2017-07-01 ENCOUNTER — Encounter (HOSPITAL_COMMUNITY)
Admission: RE | Admit: 2017-07-01 | Discharge: 2017-07-01 | Disposition: A | Discharge status: Home / Self Care | Payer: Medicare Other | Source: Ambulatory Visit | Attending: Oral Surgery | Admitting: Oral Surgery

## 2017-07-01 ENCOUNTER — Other Ambulatory Visit: Payer: Self-pay

## 2017-07-01 ENCOUNTER — Encounter (HOSPITAL_COMMUNITY): Payer: Self-pay

## 2017-07-01 DIAGNOSIS — Z6837 Body mass index (BMI) 37.0-37.9, adult: Secondary | ICD-10-CM | POA: Diagnosis not present

## 2017-07-01 DIAGNOSIS — K029 Dental caries, unspecified: Secondary | ICD-10-CM | POA: Diagnosis not present

## 2017-07-01 DIAGNOSIS — E7800 Pure hypercholesterolemia, unspecified: Secondary | ICD-10-CM | POA: Diagnosis not present

## 2017-07-01 DIAGNOSIS — F17200 Nicotine dependence, unspecified, uncomplicated: Secondary | ICD-10-CM | POA: Diagnosis not present

## 2017-07-01 DIAGNOSIS — R7303 Prediabetes: Secondary | ICD-10-CM | POA: Diagnosis not present

## 2017-07-01 DIAGNOSIS — K0530 Chronic periodontitis, unspecified: Secondary | ICD-10-CM | POA: Diagnosis not present

## 2017-07-01 DIAGNOSIS — Z79899 Other long term (current) drug therapy: Secondary | ICD-10-CM | POA: Diagnosis not present

## 2017-07-01 DIAGNOSIS — M278 Other specified diseases of jaws: Secondary | ICD-10-CM | POA: Diagnosis not present

## 2017-07-01 DIAGNOSIS — M1990 Unspecified osteoarthritis, unspecified site: Secondary | ICD-10-CM | POA: Diagnosis not present

## 2017-07-01 HISTORY — DX: Dyspnea, unspecified: R06.00

## 2017-07-01 HISTORY — DX: Prediabetes: R73.03

## 2017-07-01 LAB — CBC
HEMATOCRIT: 40.5 % (ref 39.0–52.0)
Hemoglobin: 13.5 g/dL (ref 13.0–17.0)
MCH: 29.5 pg (ref 26.0–34.0)
MCHC: 33.3 g/dL (ref 30.0–36.0)
MCV: 88.4 fL (ref 78.0–100.0)
PLATELETS: 179 10*3/uL (ref 150–400)
RBC: 4.58 MIL/uL (ref 4.22–5.81)
RDW: 14.1 % (ref 11.5–15.5)
WBC: 6 10*3/uL (ref 4.0–10.5)

## 2017-07-01 LAB — COMPREHENSIVE METABOLIC PANEL
ALK PHOS: 74 U/L (ref 38–126)
ALT: 17 U/L (ref 17–63)
ANION GAP: 7 (ref 5–15)
AST: 20 U/L (ref 15–41)
Albumin: 3.2 g/dL — ABNORMAL LOW (ref 3.5–5.0)
BUN: 15 mg/dL (ref 6–20)
CALCIUM: 8.9 mg/dL (ref 8.9–10.3)
CO2: 23 mmol/L (ref 22–32)
CREATININE: 1.11 mg/dL (ref 0.61–1.24)
Chloride: 110 mmol/L (ref 101–111)
Glucose, Bld: 119 mg/dL — ABNORMAL HIGH (ref 65–99)
Potassium: 4 mmol/L (ref 3.5–5.1)
Sodium: 140 mmol/L (ref 135–145)
Total Bilirubin: 0.4 mg/dL (ref 0.3–1.2)
Total Protein: 5.8 g/dL — ABNORMAL LOW (ref 6.5–8.1)

## 2017-07-01 LAB — GLUCOSE, CAPILLARY: GLUCOSE-CAPILLARY: 157 mg/dL — AB (ref 65–99)
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
## 2017-07-01 NOTE — Pre-Procedure Instructions (Signed)
Justin [name]  07/01/2017      [id number] [address], [place] [address] Phone: [phone] Fax: [phone]  [place] [place] [address] [place] ([place])  [address] Phone: [phone] Fax: [place], [place] [address] [place] [address] Phone: [phone] Fax: [phone]    Your procedure is scheduled on   07/03/2017  Report to [facility] Admitting at 5:30 A.M.  Call this number if you have problems the morning of surgery:  [phone]   Remember:  Do not eat food or drink liquids after midnight.  On Thursday NIGHT   Take these medicines the morning of surgery with A SIP OF WATER :              NOTHING   Do not wear jewelry   Do not wear lotions, powders, or perfumes, or deoderant.              Men may shave face and neck.   Do not bring valuables to the hospital.  [name] is not responsible for any belongings or valuables.  Contacts, dentures or bridgework may not be worn into surgery.  Leave your suitcase in the car.  After surgery it may be brought to your room.  For patients admitted to the hospital, discharge time will be determined by your treatment team.  Patients discharged the day of surgery will not be allowed to drive home.   Name and phone number of your driver:   With friend   Special instructions:  Special Instructions: [name] - Preparing for Surgery  Before surgery, you can play an important role.  Because skin is not sterile, your skin needs to be as free of germs as possible.  You can reduce the number of germs on you skin by washing with CHG (chlorahexidine gluconate) soap before surgery.  CHG is an antiseptic cleaner which kills germs and bonds with the skin to continue killing germs even after washing.  Please DO NOT use if  you have an allergy to CHG or antibacterial soaps.  If your skin becomes reddened/irritated stop using the CHG and inform your nurse when you arrive at Short Stay.  Do not shave (including legs and underarms) for at least 48 hours prior to the first CHG shower.  You may shave your face.  Please follow these instructions carefully:   1.  Shower with CHG Soap the night before surgery and the  morning of Surgery.  2.  If you choose to wash your hair, wash your hair first as usual with your  normal shampoo.  3.  After you shampoo, rinse your hair and body thoroughly to remove the  Shampoo.  4.  Use CHG as you would any other liquid soap.  You can apply chg directly to the skin and wash gently with scrungie or a clean washcloth.  5.  Apply the CHG Soap to your body ONLY FROM THE NECK DOWN.    Do not use on open wounds or open sores.  Avoid contact with your eyes, ears, mouth and genitals (private parts).  Wash genitals (private parts)   with your normal soap.  6.  Wash thoroughly, paying special attention to the area where your surgery will be performed.  7.  Thoroughly rinse your body with warm  water from the neck down.  8.  DO NOT shower/wash with your normal soap after using and rinsing off   the CHG Soap.  9.  Pat yourself dry with a clean towel.            10.  Wear clean pajamas.            11.  Place clean sheets on your bed the night of your first shower and do not sleep with pets.  Day of Surgery  Do not apply any lotions/deodorants the morning of surgery.  Please wear clean clothes to the hospital/surgery center.  Please read over the following fact sheets that you were given. Pain Booklet, Coughing and Deep Breathing and Surgical Site Infection Prevention

## 2017-07-02 MED ORDER — DEXTROSE 5 % IV SOLN
3.0000 g | INTRAVENOUS | Status: AC
  Administered 2017-07-03: 3 g via INTRAVENOUS
  Filled 2017-07-02: qty 3

## 2017-07-03 ENCOUNTER — Ambulatory Visit (HOSPITAL_COMMUNITY): Payer: Medicare Other | Admitting: Certified Registered"

## 2017-07-03 ENCOUNTER — Ambulatory Visit (HOSPITAL_COMMUNITY)
Admission: RE | Admit: 2017-07-03 | Discharge: 2017-07-03 | Disposition: A | Discharge status: Home / Self Care | Payer: Medicare Other | Source: Ambulatory Visit | Attending: Oral Surgery | Admitting: Oral Surgery

## 2017-07-03 ENCOUNTER — Encounter (HOSPITAL_COMMUNITY)
Admission: RE | Disposition: A | Discharge status: Home / Self Care | Payer: Self-pay | Source: Ambulatory Visit | Attending: Oral Surgery

## 2017-07-03 DIAGNOSIS — E782 Mixed hyperlipidemia: Secondary | ICD-10-CM | POA: Diagnosis not present

## 2017-07-03 DIAGNOSIS — M1990 Unspecified osteoarthritis, unspecified site: Secondary | ICD-10-CM | POA: Insufficient documentation

## 2017-07-03 DIAGNOSIS — F17200 Nicotine dependence, unspecified, uncomplicated: Secondary | ICD-10-CM | POA: Insufficient documentation

## 2017-07-03 DIAGNOSIS — R7303 Prediabetes: Secondary | ICD-10-CM | POA: Diagnosis not present

## 2017-07-03 DIAGNOSIS — K029 Dental caries, unspecified: Secondary | ICD-10-CM | POA: Diagnosis not present

## 2017-07-03 DIAGNOSIS — E7800 Pure hypercholesterolemia, unspecified: Secondary | ICD-10-CM | POA: Insufficient documentation

## 2017-07-03 DIAGNOSIS — K0530 Chronic periodontitis, unspecified: Secondary | ICD-10-CM | POA: Diagnosis not present

## 2017-07-03 DIAGNOSIS — Z79899 Other long term (current) drug therapy: Secondary | ICD-10-CM | POA: Insufficient documentation

## 2017-07-03 DIAGNOSIS — Z6837 Body mass index (BMI) 37.0-37.9, adult: Secondary | ICD-10-CM | POA: Insufficient documentation

## 2017-07-03 DIAGNOSIS — I10 Essential (primary) hypertension: Secondary | ICD-10-CM | POA: Diagnosis not present

## 2017-07-03 DIAGNOSIS — M278 Other specified diseases of jaws: Secondary | ICD-10-CM | POA: Insufficient documentation

## 2017-07-03 DIAGNOSIS — K0889 Other specified disorders of teeth and supporting structures: Secondary | ICD-10-CM | POA: Diagnosis not present

## 2017-07-03 HISTORY — PX: MULTIPLE EXTRACTIONS WITH ALVEOLOPLASTY: SHX5342

## 2017-07-03 SURGERY — MULTIPLE EXTRACTION WITH ALVEOLOPLASTY
Anesthesia: General | Site: Mouth

## 2017-07-03 MED ORDER — PROPOFOL 10 MG/ML IV BOLUS
INTRAVENOUS | Status: AC
  Filled 2017-07-03: qty 20

## 2017-07-03 MED ORDER — ROCURONIUM BROMIDE 10 MG/ML (PF) SYRINGE
PREFILLED_SYRINGE | INTRAVENOUS | Status: AC
  Filled 2017-07-03: qty 5

## 2017-07-03 MED ORDER — LIDOCAINE-EPINEPHRINE 2 %-1:100000 IJ SOLN
INTRAMUSCULAR | Status: AC
  Filled 2017-07-03: qty 1

## 2017-07-03 MED ORDER — LIDOCAINE-EPINEPHRINE 2 %-1:100000 IJ SOLN
INTRAMUSCULAR | Status: DC | PRN
  Administered 2017-07-03: 15 mL via INTRADERMAL

## 2017-07-03 MED ORDER — LIDOCAINE HCL (CARDIAC) 20 MG/ML IV SOLN
INTRAVENOUS | Status: DC | PRN
  Administered 2017-07-03: 60 mg via INTRAVENOUS

## 2017-07-03 MED ORDER — ONDANSETRON HCL 4 MG/2ML IJ SOLN
INTRAMUSCULAR | Status: DC | PRN
  Administered 2017-07-03: 4 mg via INTRAVENOUS

## 2017-07-03 MED ORDER — 0.9 % SODIUM CHLORIDE (POUR BTL) OPTIME
TOPICAL | Status: DC | PRN
  Administered 2017-07-03: 1000 mL

## 2017-07-03 MED ORDER — OXYMETAZOLINE HCL 0.05 % NA SOLN
NASAL | Status: AC
  Filled 2017-07-03: qty 15

## 2017-07-03 MED ORDER — OXYCODONE-ACETAMINOPHEN 5-325 MG PO TABS: 1.0000 | ORAL_TABLET | ORAL | 0 refills | Status: DC | PRN

## 2017-07-03 MED ORDER — MIDAZOLAM HCL 2 MG/2ML IJ SOLN
INTRAMUSCULAR | Status: AC
  Filled 2017-07-03: qty 2

## 2017-07-03 MED ORDER — LIDOCAINE 2% (20 MG/ML) 5 ML SYRINGE
INTRAMUSCULAR | Status: AC
  Filled 2017-07-03: qty 5

## 2017-07-03 MED ORDER — OXYMETAZOLINE HCL 0.05 % NA SOLN
NASAL | Status: DC | PRN
  Administered 2017-07-03 (×3): 2 via NASAL

## 2017-07-03 MED ORDER — DEXAMETHASONE SODIUM PHOSPHATE 10 MG/ML IJ SOLN
INTRAMUSCULAR | Status: DC | PRN
  Administered 2017-07-03: 10 mg via INTRAVENOUS

## 2017-07-03 MED ORDER — FENTANYL CITRATE (PF) 250 MCG/5ML IJ SOLN
INTRAMUSCULAR | Status: AC
  Filled 2017-07-03: qty 5

## 2017-07-03 MED ORDER — SUCCINYLCHOLINE CHLORIDE 200 MG/10ML IV SOSY
PREFILLED_SYRINGE | INTRAVENOUS | Status: AC
  Filled 2017-07-03: qty 10

## 2017-07-03 MED ORDER — SODIUM CHLORIDE 0.9 % IR SOLN
Status: DC | PRN
  Administered 2017-07-03: 1000 mL

## 2017-07-03 MED ORDER — SUCCINYLCHOLINE CHLORIDE 20 MG/ML IJ SOLN
INTRAMUSCULAR | Status: DC | PRN
  Administered 2017-07-03: 100 mg via INTRAVENOUS

## 2017-07-03 MED ORDER — LACTATED RINGERS IV SOLN
INTRAVENOUS | Status: DC | PRN
  Administered 2017-07-03 (×2): via INTRAVENOUS

## 2017-07-03 MED ORDER — MIDAZOLAM HCL 5 MG/5ML IJ SOLN
INTRAMUSCULAR | Status: DC | PRN
  Administered 2017-07-03: 2 mg via INTRAVENOUS

## 2017-07-03 MED ORDER — AMOXICILLIN 500 MG PO CAPS: 500.0000 mg | ORAL_CAPSULE | Freq: Three times a day (TID) | ORAL | 0 refills | Status: DC

## 2017-07-03 MED ORDER — PROPOFOL 10 MG/ML IV BOLUS
INTRAVENOUS | Status: DC | PRN
  Administered 2017-07-03: 130 mg via INTRAVENOUS
  Administered 2017-07-03: 20 mg via INTRAVENOUS

## 2017-07-03 MED ORDER — FENTANYL CITRATE (PF) 100 MCG/2ML IJ SOLN
INTRAMUSCULAR | Status: DC | PRN
Start: 2017-07-03 — End: 2017-07-03
  Administered 2017-07-03: 150 ug via INTRAVENOUS
  Administered 2017-07-03 (×2): 50 ug via INTRAVENOUS

## 2017-07-03 SURGICAL SUPPLY — 29 items
BUR CROSS CUT FISSURE 1.6 (BURR) ×2 IMPLANT
BUR CROSS CUT FISSURE 1.6MM (BURR) ×1
BUR EGG ELITE 4.0 (BURR) ×2 IMPLANT
BUR EGG ELITE 4.0MM (BURR) ×1
CANISTER SUCT 3000ML PPV (MISCELLANEOUS) ×3 IMPLANT
COVER SURGICAL LIGHT HANDLE (MISCELLANEOUS) ×3 IMPLANT
CRADLE DONUT ADULT HEAD (MISCELLANEOUS) ×3 IMPLANT
DECANTER SPIKE VIAL GLASS SM (MISCELLANEOUS) IMPLANT
DRAPE U-SHAPE 76X120 STRL (DRAPES) ×3 IMPLANT
FLUID NSS /IRRIG 1000 ML XXX (MISCELLANEOUS) ×3 IMPLANT
GAUZE PACKING FOLDED 2  STR (GAUZE/BANDAGES/DRESSINGS) ×2
GAUZE PACKING FOLDED 2 STR (GAUZE/BANDAGES/DRESSINGS) ×1 IMPLANT
GLOVE BIO SURGEON STRL SZ 6.5 (GLOVE) ×2 IMPLANT
GLOVE BIO SURGEON STRL SZ7.5 (GLOVE) ×3 IMPLANT
GLOVE BIO SURGEONS STRL SZ 6.5 (GLOVE) ×1
GOWN STRL REUS W/ TWL LRG LVL3 (GOWN DISPOSABLE) ×1 IMPLANT
GOWN STRL REUS W/ TWL XL LVL3 (GOWN DISPOSABLE) ×1 IMPLANT
GOWN STRL REUS W/TWL LRG LVL3 (GOWN DISPOSABLE) ×3
GOWN STRL REUS W/TWL XL LVL3 (GOWN DISPOSABLE) ×3
KIT BASIN OR (CUSTOM PROCEDURE TRAY) ×3 IMPLANT
KIT ROOM TURNOVER OR (KITS) ×3 IMPLANT
NEEDLE 22X1 1/2 (OR ONLY) (NEEDLE) ×6 IMPLANT
NS IRRIG 1000ML POUR BTL (IV SOLUTION) ×3 IMPLANT
PAD ARMBOARD 7.5X6 YLW CONV (MISCELLANEOUS) ×3 IMPLANT
SUT CHROMIC 3 0 PS 2 (SUTURE) ×5 IMPLANT
SYR CONTROL 10ML LL (SYRINGE) ×3 IMPLANT
TRAY ENT MC OR (CUSTOM PROCEDURE TRAY) ×3 IMPLANT
TUBING IRRIGATION (MISCELLANEOUS) ×3 IMPLANT
YANKAUER SUCT BULB TIP NO VENT (SUCTIONS) ×3 IMPLANT

## 2017-07-03 NOTE — Transfer of Care (Signed)
Immediate Anesthesia Transfer of Care Note  Patient: Justin [name]  Procedure(s) Performed: MULTIPLE EXTRACTION WITH ALVEOLOPLASTY (N/A Mouth)  Patient Location: PACU  Anesthesia Type:General  Level of Consciousness: awake, alert  and patient cooperative  Airway & Oxygen Therapy: Patient Spontanous Breathing  Post-op Assessment: Report given to RN and Post -op Vital signs reviewed and stable  Post vital signs: Reviewed and stable  Last Vitals:  Vitals:   07/03/17 0613  BP: 134/86  Pulse: 60  Resp: 20  Temp: 36.8 C  SpO2: 100%    Last Pain:  Vitals:   07/03/17 0643  TempSrc:   PainSc: 0-No pain         Complications: No apparent anesthesia complications

## 2017-07-03 NOTE — Op Note (Signed)
07/03/2017  8:09 AM  PATIENT:  Justin [name]  61 y.o. male  PRE-OPERATIVE DIAGNOSIS:  NON RESTORABLE  TEETH #11, 22, 27, mandibular torus, maxillary exostosis  POST-OPERATIVE DIAGNOSIS:  SAME  PROCEDURE:  Procedure(s):  EXTRACTION TEETH #11, 22, 27, ALVEOLOPLASTY, Removal right mandibular torus, Removal right maxillary buccal exostosis  SURGEON:  Surgeon(s): Diona Browner, DDS  ANESTHESIA:   local and general  EBL:  minimal  DRAINS: none   SPECIMEN:  No Specimen  COUNTS:  YES  PLAN OF CARE: Discharge to home after PACU  PATIENT DISPOSITION:  PACU - hemodynamically stable.   PROCEDURE DETAILS: Dictation # [id number]  Justin [name], DMD 07/03/2017 8:09 AM

## 2017-07-03 NOTE — H&P (Signed)
HISTORY AND PHYSICAL  Justin [name] is a 61 y.o. male patient with CC: referred by general dentist for extractions, removal tori and exostoses in preparation for dentures  No diagnosis found.  Past Medical History:  Diagnosis Date  . Arthritis    states he was given med. for inflammation in his "bones" but he isn't taking it right now, remarks " i'm not going to to take something they just throw at me for no reason"  . Dyspnea    pt. reports this SOB worsens wioth increased heat   . Hypercholesteremia   . Pre-diabetes    pre diabetes     Current Facility-Administered Medications  Medication Dose Route Frequency Provider Last Rate Last Dose  . oxymetazoline (AFRIN) 0.05 % nasal spray           . ceFAZolin (ANCEF) 3 g in dextrose 5 % 50 mL IVPB  3 g Intravenous On Call to [place], RPH       No Known Allergies Active Problems:   * No active hospital problems. *  Vitals: Blood pressure 134/86, pulse 60, temperature 98.2 F (36.8 C), temperature source Oral, resp. rate 20, weight 272 lb (123.4 kg), SpO2 100 %. Lab results:No results found for this or any previous visit (from the past 57 hour(s)). Radiology Results: No results found. General appearance: alert, cooperative and morbidly obese Head: Normocephalic, without obvious abnormality, atraumatic Eyes: negative Nose: Nares normal. Septum midline. Mucosa normal. No drainage or sinus tenderness. Throat: Carious teeth # 11, 22, 27 with bone loss; bilateral mandibular lingual tori. maxillary buccal exostosis. pharynx clear no trismus Neck: no adenopathy, supple, symmetrical, trachea midline and thyroid not enlarged, symmetric, no tenderness/mass/nodules Resp: clear to auscultation bilaterally Cardio: regular rate and rhythm, S1, S2 normal, no murmur, click, rub or gallop  Assessment:Nonrestorable teeth secondary to dental caries, periodontitis. Mandibular tori, maxillary exostosis  Plan: Extractions with  alveoloplasty, removal tori, exostosis. GA. Nasal. Day surgery.   Justin [name] M 07/03/2017

## 2017-07-03 NOTE — Anesthesia Preprocedure Evaluation (Signed)
Anesthesia Evaluation  Patient identified by MRN, date of birth, ID band Patient awake    Reviewed: Allergy & Precautions, NPO status , Patient's Chart, lab work & pertinent test results  History of Anesthesia Complications Negative for: history of anesthetic complications  Airway Mallampati: II  TM Distance: >3 FB Neck ROM: Full    Dental  (+) Poor Dentition, Missing   Pulmonary shortness of breath, Current Smoker,    breath sounds clear to auscultation       Cardiovascular hypertension, (-) angina(-) Past MI and (-) CHF  Rhythm:Regular     Neuro/Psych negative neurological ROS  negative psych ROS   GI/Hepatic negative GI ROS, Neg liver ROS,   Endo/Other  Morbid obesity  Renal/GU negative Renal ROS     Musculoskeletal  (+) Arthritis ,   Abdominal   Peds  Hematology negative hematology ROS (+)   Anesthesia Other Findings   Reproductive/Obstetrics                             Anesthesia Physical Anesthesia Plan  ASA: II  Anesthesia Plan: General   Post-op Pain Management:    Induction: Intravenous  PONV Risk Score and Plan: 1 and Ondansetron  Airway Management Planned: Nasal ETT  Additional Equipment: None  Intra-op Plan:   Post-operative Plan: Extubation in OR  Informed Consent: I have reviewed the patients History and Physical, chart, labs and discussed the procedure including the risks, benefits and alternatives for the proposed anesthesia with the patient or authorized representative who has indicated his/her understanding and acceptance.   Dental advisory given  Plan Discussed with: CRNA and Surgeon  Anesthesia Plan Comments:         Anesthesia Quick Evaluation

## 2017-07-03 NOTE — Anesthesia Procedure Notes (Signed)
Procedure Name: Intubation Date/Time: 07/03/2017 7:38 AM Performed by: Lance Coon, CRNA Pre-anesthesia Checklist: Patient identified, Emergency Drugs available, Suction available, Patient being monitored and Timeout performed Patient Re-evaluated:Patient Re-evaluated prior to induction Oxygen Delivery Method: Circle system utilized Preoxygenation: Pre-oxygenation with 100% oxygen Induction Type: IV induction Ventilation: Mask ventilation without difficulty Laryngoscope Size: Miller and 3 Grade View: Grade I Nasal Tubes: Right, Nasal prep performed and Nasal Rae Tube size: 7.5 mm Number of attempts: 1 Placement Confirmation: ETT inserted through vocal cords under direct vision,  positive ETCO2 and breath sounds checked- equal and bilateral Tube secured with: Tape Dental Injury: Teeth and Oropharynx as per pre-operative assessment

## 2017-07-06 ENCOUNTER — Encounter (HOSPITAL_COMMUNITY): Payer: Self-pay | Admitting: Oral Surgery

## 2017-07-06 NOTE — Op Note (Signed)
NAMEDRAVON, NOTT                ACCOUNT NO.:  [id number]  MEDICAL RECORD NO.:  [id number]  LOCATION:                                 FACILITY:  PHYSICIAN:  Gae Bon, M.D.       DATE OF BIRTH:  DATE OF PROCEDURE:  07/03/2017 DATE OF DISCHARGE:                              OPERATIVE REPORT   PREOPERATIVE DIAGNOSES: 1. Nonrestorable teeth numbers 11, 22, 27. 2. Right mandibular lingual torus. 3. Right maxillary buccal exostosis.  POSTOPERATIVE DIAGNOSES: 1. Nonrestorable teeth numbers 11, 22, 27. 2. Right mandibular lingual torus. 3. Right maxillary buccal exostosis.  PROCEDURES: 1. Extraction teeth numbers 11, 22, 27. 2. Alveoplasty. 3. Removal of right mandibular lingual torus. 4. Removal of right maxillary buccal exostosis.  SURGEON:  Gae Bon, M.D.  ANESTHESIA:  Cassandria Santee attending.  PROCEDURE IN DETAIL:  The patient was taken to the operating room, placed on the table in supine position.  General anesthesia administered intravenously and nasal endotracheal tube was placed and secured.  The eyes were protected and the patient was draped for the procedure.  Time- out was performed.  The posterior pharynx was suctioned.  A throat pack was placed.  A 2% lidocaine with 1:100,000 epinephrine was infiltrated in an inferior alveolar block on the right and left side and buccal infiltration and palatal infiltration in the right and left maxilla.  A bite-block was placed in the right side of the mouth and a sweetheart retractor was used to retract the tongue.  A #15 blade used to make an incision around tooth #22 with approximately 1 cm proximal and distal extension along the alveolar crest.  Another incision similarly was made around tooth #11.  Then, a periosteal elevator was used to reflect the periosteum.  The handpiece with a fissure burr was used to remove bone around tooth #22 as it was decayed to the gumline and only a root remained.  Then, this  tooth was elevated and removed with the Asch forceps.  Tooth #11 was removed with the upper universal forceps and the sockets were curetted and then the periosteum was further reflected to expose the alveolar crest, which was irregular in contour or owing to the bony sharp socket areas.  These were reduced with the egg-shaped burr and bone file.  Then, the areas were irrigated and closed with 3-0 chromic.  The sweetheart and bite-block were repositioned to the other side of the mouth.  A 15 blade was used to make an incision along the crest of the mandibular ridge approximately 2 cm proximal to tooth #27 and 1 cm distal.  Similar incision was made in the maxilla on the alveolar crest overlying buccal exostosis that was in the premolar region approximately 1.5 x 1.0 cm.  The periosteum was reflected and then tooth #27 was removed using the Asch forceps.  The mucosal tissue was reflected around the socket to enable an alveoplasty with an egg- shaped burr and bone file, and then using a Seldin retractor, the lingual tissue was reflected and the mandibular lingual torus was removed with the burr and further smoothed with a bone file.  Then, the area was irrigated  and closed with 3-0 chromic in the maxilla, the periosteum having already been reflected to expose the buccal exostosis. The egg-shaped burr and bone file were used to reduce this and achieve a smooth contour in the area.  Then, the area was irrigated and closed with 3-0 chromic.  The oral cavity was then irrigated and suctioned. The throat pack was removed.  The patient was left in care of anesthesia for extubation and transportation to recovery room and plans for later discharge home.  ESTIMATED BLOOD LOSS:  Minimal.  COMPLICATIONS:  None.  SPECIMENS:  None.     Gae Bon, M.D.   ______________________________ Gae Bon, M.D.    SMJ/MEDQ  D:  07/03/2017  T:  07/04/2017  Job:  [id number]

## 2017-07-07 NOTE — Anesthesia Postprocedure Evaluation (Signed)
Anesthesia Post Note  Patient: SUN WILENSKY  Procedure(s) Performed: MULTIPLE EXTRACTION WITH ALVEOLOPLASTY (N/A Mouth)     Patient location during evaluation: PACU Anesthesia Type: General Level of consciousness: awake and alert Pain management: pain level controlled Vital Signs Assessment: post-procedure vital signs reviewed and stable Respiratory status: spontaneous breathing, nonlabored ventilation, respiratory function stable and patient connected to nasal cannula oxygen Cardiovascular status: blood pressure returned to baseline and stable Postop Assessment: no apparent nausea or vomiting Anesthetic complications: no    Last Vitals:  Vitals:   07/03/17 0858 07/03/17 0911  BP: (!) 145/79 139/82  Pulse: 71 63  Resp: 18   Temp:  (!) 36.1 C  SpO2: 97% 96%    Last Pain:  Vitals:   07/03/17 0911  TempSrc:   PainSc: 0-No pain                 Jailene Cupit

## 2017-11-13 ENCOUNTER — Ambulatory Visit: Payer: Self-pay | Admitting: Internal Medicine

## 2017-12-21 ENCOUNTER — Encounter: Payer: Self-pay | Admitting: Internal Medicine

## 2017-12-21 ENCOUNTER — Ambulatory Visit: Payer: Medicare Other | Attending: Internal Medicine | Admitting: Internal Medicine

## 2017-12-21 VITALS — BP 124/84 | HR 70 | Temp 98.2°F | Resp 16 | Ht 71.0 in | Wt 284.8 lb

## 2017-12-21 DIAGNOSIS — R7303 Prediabetes: Secondary | ICD-10-CM | POA: Diagnosis not present

## 2017-12-21 DIAGNOSIS — F17210 Nicotine dependence, cigarettes, uncomplicated: Secondary | ICD-10-CM | POA: Diagnosis not present

## 2017-12-21 DIAGNOSIS — R358 Other polyuria: Secondary | ICD-10-CM | POA: Diagnosis not present

## 2017-12-21 DIAGNOSIS — E782 Mixed hyperlipidemia: Secondary | ICD-10-CM | POA: Insufficient documentation

## 2017-12-21 DIAGNOSIS — Z6830 Body mass index (BMI) 30.0-30.9, adult: Secondary | ICD-10-CM | POA: Insufficient documentation

## 2017-12-21 DIAGNOSIS — Z1159 Encounter for screening for other viral diseases: Secondary | ICD-10-CM | POA: Diagnosis not present

## 2017-12-21 DIAGNOSIS — M961 Postlaminectomy syndrome, not elsewhere classified: Secondary | ICD-10-CM | POA: Insufficient documentation

## 2017-12-21 DIAGNOSIS — Z114 Encounter for screening for human immunodeficiency virus [HIV]: Secondary | ICD-10-CM

## 2017-12-21 DIAGNOSIS — Z23 Encounter for immunization: Secondary | ICD-10-CM | POA: Diagnosis not present

## 2017-12-21 DIAGNOSIS — I10 Essential (primary) hypertension: Secondary | ICD-10-CM | POA: Insufficient documentation

## 2017-12-21 DIAGNOSIS — Z720 Tobacco use: Secondary | ICD-10-CM

## 2017-12-21 DIAGNOSIS — M170 Bilateral primary osteoarthritis of knee: Secondary | ICD-10-CM | POA: Diagnosis not present

## 2017-12-21 DIAGNOSIS — R3589 Other polyuria: Secondary | ICD-10-CM

## 2017-12-21 DIAGNOSIS — E669 Obesity, unspecified: Secondary | ICD-10-CM | POA: Insufficient documentation

## 2017-12-21 LAB — POCT GLYCOSYLATED HEMOGLOBIN (HGB A1C): HEMOGLOBIN A1C: 5.9

## 2017-12-21 LAB — GLUCOSE, POCT (MANUAL RESULT ENTRY): POC GLUCOSE: 124 mg/dL — AB (ref 70–99)

## 2017-12-21 MED ORDER — TETANUS-DIPHTH-ACELL PERTUSSIS 5-2.5-18.5 LF-MCG/0.5 IM SUSP: 0.5000 mL | Freq: Once | INTRAMUSCULAR | 0 refills | Status: AC

## 2017-12-21 MED ORDER — TAMSULOSIN HCL 0.4 MG PO CAPS: 0.4000 mg | ORAL_CAPSULE | Freq: Every day | ORAL | 3 refills | Status: DC

## 2017-12-21 MED ORDER — PNEUMOCOCCAL VAC POLYVALENT 25 MCG/0.5ML IJ INJ: 0.5000 mL | INJECTION | INTRAMUSCULAR | 0 refills | Status: DC

## 2017-12-21 MED FILL — PNEUMOVAX 23 VIAL: 25 | 1 days supply | Qty: 1 | Fill #0

## 2017-12-21 MED FILL — BOOSTRIX VACCINE SYRINGE: 5-2.5-18.5 | 1 days supply | Qty: 1 | Fill #0

## 2017-12-21 NOTE — Patient Instructions (Signed)
Your blood pressure is slightly elevated.  Please work on limiting the salt in the foods.  You have symptoms to suggest enlargement of the prostate.  We will try you on a low dose of the medication called Flomax to see if the frequent urination decreases.  Follow a Healthy Eating Plan - You can do it! Limit sugary drinks.  Avoid sodas, sweet tea, sport or energy drinks, or fruit drinks.  Drink water, lo-fat milk, or diet drinks. Limit snack foods.   Cut back on candy, cake, cookies, chips, ice cream.  These are a special treat, only in small amounts. Eat plenty of vegetables.  Especially dark green, red, and orange vegetables. Aim for at least 3 servings a day. More is better! Include fruit in your daily diet.  Whole fruit is much healthier than fruit juice! Limit "white" bread, "white" pasta, "white" rice.   Choose "100% whole grain" products, brown or wild rice. Avoid fatty meats. Try "Meatless Monday" and choose eggs or beans one day a week.  When eating meat, choose lean meats like chicken, [place], and fish.  Grill, broil, or bake meats instead of frying, and eat poultry without the skin. Eat less salt.  Avoid frozen pizzas, frozen dinners and salty foods.  Use seasonings other than salt in cooking.  This can help blood pressure and keep you from swelling Beer, wine and liquor have calories.  If you can safely drink alcohol, limit to 1 drink per day for women, 2 drinks for men

## 2017-12-21 NOTE — Progress Notes (Signed)
Patient ID: Justin [name], male    DOB: 1956/03/17  MRN: [id number]  CC: re-estbalish; Diabetes (pre-diabetes); and Hypertension   Subjective: Justin [name] is a 62 y.o. male who presents for chronic disease management and to establish care with me as PCP His concerns today include:  Patient with history of HL, chronic lower back pain with history of back surgery in 2005, pre-DM, tob dep  1. Tob dep: had quit for 3 mth and started smoking again 2 mths ago due to stress.  Currently a pk would last 1 mth.  -gained about 9-10 lbs when he quit.  -plans to start going to free gym in his apartment complex and plans to join an outside gym.  he has set gold of getting down to 250 lbs.   Plans to go 3 x a wk to do cardio exercises.   Has arthritis in knees.  + stiffness and popping in the jts -Denies any chest pains.  Used to get heartburn.  Used to get pain in the neck with some numbness into the left shoulder  2.  Pre-DM:  He has cut down on eating fried foods, and pork. He drinks water; Ginger Ale, Sprite, Sweet Tea.  Eats a lot of pasta and rice.  3.  HL:  Out of Lipitor for a while.  He feels it made him urinate more especially at nights.  When asked further about his nocturia, patient reports getting up 3-4 times at night to urinate.  His stream is good.  He does not have to strain to get his flow going.  No post void with dribbling.  He denies drinking coffee or alcoholic beverages at night.  His father had early prostate cancer.  Patient Active Problem List   Diagnosis Date Noted  . Mixed hyperlipidemia 02/23/2017  . Prediabetes 02/18/2017  . Essential hypertension 12/27/2016  . Tobacco abuse 03/03/2016  . Lumbosacral spondylosis without myelopathy 03/08/2014  . Lumbar post-laminectomy syndrome 03/08/2014  . Dyslipidemia 06/06/2013  . Obesity (BMI 30-39.9) 06/06/2013  . Low back pain 11/17/2012  . Lumbago 11/17/2012     No current outpatient medications on file prior to visit.    No current facility-administered medications on file prior to visit.     No Known Allergies  Social History   Socioeconomic History  . Marital status: Single    Spouse name: Not on file  . Number of children: Not on file  . Years of education: Not on file  . Highest education level: Not on file  Occupational History  . Not on file  Social Needs  . Financial resource strain: Not on file  . Food insecurity:    Worry: Not on file    Inability: Not on file  . Transportation needs:    Medical: Not on file    Non-medical: Not on file  Tobacco Use  . Smoking status: Current Every Day Smoker    Packs/day: 0.25    Types: Cigarettes  . Smokeless tobacco: Never Used  Substance and Sexual Activity  . Alcohol use: Yes    Alcohol/week: 1.2 - 1.8 oz    Types: 2 - 3 Cans of beer per week    Comment: occasional  . Drug use: Not on file  . Sexual activity: Not on file  Lifestyle  . Physical activity:    Days per week: Not on file    Minutes per session: Not on file  . Stress: Not on file  Relationships  .  Social connections:    Talks on phone: Not on file    Gets together: Not on file    Attends religious service: Not on file    Active member of club or organization: Not on file    Attends meetings of clubs or organizations: Not on file    Relationship status: Not on file  . Intimate partner violence:    Fear of current or ex partner: Not on file    Emotionally abused: Not on file    Physically abused: Not on file    Forced sexual activity: Not on file  Other Topics Concern  . Not on file  Social History Narrative  . Not on file    Family History  Problem Relation Age of Onset  . Colon cancer Father   . Heart disease Father   . Heart disease Mother     Past Surgical History:  Procedure Laterality Date  . BACK SURGERY  06/24/2004   [facility]-   . MULTIPLE EXTRACTIONS WITH ALVEOLOPLASTY N/A 07/03/2017   Procedure: MULTIPLE EXTRACTION WITH ALVEOLOPLASTY;  Surgeon:  Diona Browner, DDS;  Location: [place];  Service: Oral Surgery;  Laterality: N/A;  . TONSILLECTOMY    . UMBILICAL HERNIA REPAIR      ROS: Review of Systems Negative except as stated above PHYSICAL EXAM: BP 124/84   Pulse 70   Temp 98.2 F (36.8 C) (Oral)   Resp 16   Ht 5\' 11"  (1.803 m)   Wt 284 lb 12.8 oz (129.2 kg)   SpO2 95%   BMI 39.72 kg/m   Wt Readings from Last 3 Encounters:  12/21/17 284 lb 12.8 oz (129.2 kg)  07/03/17 272 lb (123.4 kg)  07/01/17 272 lb 4.8 oz (123.5 kg)    Physical Exam General appearance - alert, well appearing, and in no distress Mental status - alert, oriented to person, place, and time, normal mood, behavior, speech, dress, motor activity, and thought processes Mouth -edentulous Neck - supple, no significant adenopathy Chest - clear to auscultation, no wheezes, rales or rhonchi, symmetric air entry Heart - normal rate, regular rhythm, normal S1, S2, no murmurs, rubs, clicks or gallops Musculoskeletal -knees: No point tenderness.  Mild joint enlargement.  Good range of motion  extremities -no lower extremity edema  BS 124/A1C 5.9  Lab Results  Component Value Date   WBC 6.0 07/01/2017   HGB 13.5 07/01/2017   HCT 40.5 07/01/2017   MCV 88.4 07/01/2017   PLT 179 07/01/2017     Chemistry      Component Value Date/Time   NA 140 07/01/2017 1311   NA 141 02/18/2017 1115   K 4.0 07/01/2017 1311   CL 110 07/01/2017 1311   CO2 23 07/01/2017 1311   BUN 15 07/01/2017 1311   BUN 14 02/18/2017 1115   CREATININE 1.11 07/01/2017 1311   CREATININE 1.20 03/03/2016 1521      Component Value Date/Time   CALCIUM 8.9 07/01/2017 1311   ALKPHOS 74 07/01/2017 1311   AST 20 07/01/2017 1311   ALT 17 07/01/2017 1311   BILITOT 0.4 07/01/2017 1311     Lab Results  Component Value Date   CHOL 214 (H) 02/18/2017   HDL 56 02/18/2017   LDLCALC 135 (H) 02/18/2017   TRIG 115 02/18/2017   CHOLHDL 3.8 02/18/2017    ASSESSMENT AND PLAN: 1.  Prediabetes -Patient counseled extensively on healthy eating habits.  Advised to eliminate sugary drinks including regular sodas and sweet tea.  Drink more  water.  Advised to cut back on white carbohydrates.  Eat whole-grain wheat bread instead of white bread. -Start low go slow with exercise routine with goal.building up to 30 minutes 3 times a week.  Follow-up if any chest pain or  excessive shortness of breath with exercise - POCT glucose (manual entry) - POCT glycosylated hemoglobin (Hb A1C) - Microalbumin / creatinine urine ratio  2. Essential hypertension Diastolic blood pressure is elevated.  He is currently not on any blood pressure medication.  DASH diet discussed.  We will recheck on follow-up visit.  3. Tobacco abuse Patient advised to quit smoking. Discussed health risks associated with smoking including lung and other types of cancers, chronic lung diseases and CV risks.. Pt not ready to give trail of quitting.  Discussed methods to help quit including quitting cold [place], use of NRT.  Patient reports that he will quit once he starts exercising as that would give him something to do to keep his mind off of smoking cigarettes. Less than 5 minutes spent on counseling  4. Mixed hyperlipidemia Patient's ASCVD risk score was 26%.  He would benefit from being on statin.  However since he has been off of it for a while we agreed to recheck his cholesterol to see whether it has improved,.  If it has not he will consider going back on statin therapy to decrease cardiovascular risks - Lipid panel  5. Primary osteoarthritis of both knees Weight loss and regular exercise encouraged. Tylenol over-the-counter as needed.  6. Obesity (BMI 30-39.9) See #1 above. 7. Screening for HIV (human immunodeficiency virus) - HIV antibody  8. Need for hepatitis C screening test - Hepatitis C Antibody  9. Polyuria Patient declined rectal exam.  He is however agreeable for PSA level check and trial  of Flomax for nocturia which I think is the result of early BPH - PSA - tamsulosin (FLOMAX) 0.4 MG CAPS capsule; Take 1 capsule (0.4 mg total) by mouth daily.  Dispense: 30 capsule; Refill: 3  10. Need for Tdap vaccination   Patient was given the opportunity to ask questions.  Patient verbalized understanding of the plan and was able to repeat key elements of the plan.   Orders Placed This Encounter  Procedures  . Microalbumin / creatinine urine ratio  . HIV antibody  . Hepatitis C Antibody  . Lipid panel  . PSA  . POCT glucose (manual entry)  . POCT glycosylated hemoglobin (Hb A1C)     Requested Prescriptions   Signed Prescriptions Disp Refills  . Tdap (BOOSTRIX) 5-2.5-18.5 LF-MCG/0.5 injection 0.5 mL 0    Sig: Inject 0.5 mLs into the muscle once for 1 dose.  . tamsulosin (FLOMAX) 0.4 MG CAPS capsule 30 capsule 3    Sig: Take 1 capsule (0.4 mg total) by mouth daily.    Return in about 2 months (around 02/20/2018).  Karle Plumber, MD, FACP

## 2017-12-22 ENCOUNTER — Other Ambulatory Visit: Payer: Self-pay | Admitting: Internal Medicine

## 2017-12-22 LAB — LIPID PANEL
Chol/HDL Ratio: 4.4 ratio (ref 0.0–5.0)
Cholesterol, Total: 224 mg/dL — ABNORMAL HIGH (ref 100–199)
HDL: 51 mg/dL (ref >39)
LDL Calculated: 139 mg/dL — ABNORMAL HIGH (ref 0–99)
TRIGLYCERIDES: 171 mg/dL — AB (ref 0–149)
VLDL CHOLESTEROL CAL: 34 mg/dL (ref 5–40)

## 2017-12-22 LAB — MICROALBUMIN / CREATININE URINE RATIO
CREATININE, UR: 74.8 mg/dL
Microalb/Creat Ratio: <4 mg/g creat (ref 0.0–30.0)
Microalbumin, Urine: <3 ug/mL

## 2017-12-22 LAB — PSA: PROSTATE SPECIFIC AG, SERUM: 0.6 ng/mL (ref 0.0–4.0)

## 2017-12-22 LAB — HIV ANTIBODY (ROUTINE TESTING W REFLEX): HIV Screen 4th Generation wRfx: NONREACTIVE

## 2017-12-22 LAB — HEPATITIS C ANTIBODY

## 2017-12-22 MED ORDER — ATORVASTATIN CALCIUM 10 MG PO TABS: 10.0000 mg | ORAL_TABLET | Freq: Every day | ORAL | 3 refills | Status: DC

## 2017-12-25 ENCOUNTER — Telehealth: Payer: Self-pay

## 2017-12-25 NOTE — Telephone Encounter (Signed)
Contacted pt to go over lab results pt didn't answer and was unable to lvm  If pt calls back please give result: HIV test is negative. Prostate level call the PSA was normal. Cholesterol level remains elevated. I recommend restarting Lipitor to help lower his cholesterol and decrease risk of heart attack and strokes. Prescription was sent to his pharmacy.

## 2018-02-19 ENCOUNTER — Ambulatory Visit: Payer: Self-pay | Admitting: Internal Medicine

## 2018-03-09 ENCOUNTER — Ambulatory Visit: Payer: Self-pay | Admitting: Internal Medicine

## 2018-06-21 DIAGNOSIS — H0289 Other specified disorders of eyelid: Secondary | ICD-10-CM | POA: Diagnosis not present

## 2018-06-21 DIAGNOSIS — H25813 Combined forms of age-related cataract, bilateral: Secondary | ICD-10-CM | POA: Diagnosis not present

## 2018-06-21 DIAGNOSIS — E119 Type 2 diabetes mellitus without complications: Secondary | ICD-10-CM | POA: Diagnosis not present

## 2018-06-21 LAB — HM DIABETES EYE EXAM

## 2019-01-30 IMAGING — CT CT HEAD W/O CM
4 series · 16 of 47 positions shown, 18 images · non-contrast
Comparison: None.

CLINICAL DATA: Acute onset of headache.  Initial encounter.

EXAM:
CT HEAD WITHOUT CONTRAST
TECHNIQUE: Contiguous axial images were obtained from the base of the skull
through the vertex without intravenous contrast.

[Series 3: head without · axial · non-contrast · 0.46mm/px · z∈[+128,+258]mm · 7 of 36 slices shown, 9 images]
[im 5/36  brain]
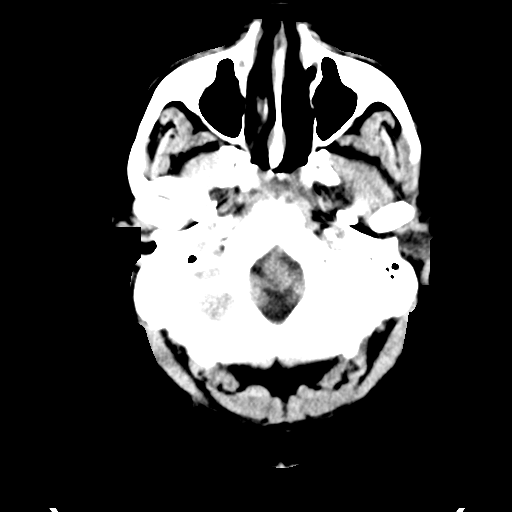
[im 5/36  bone]
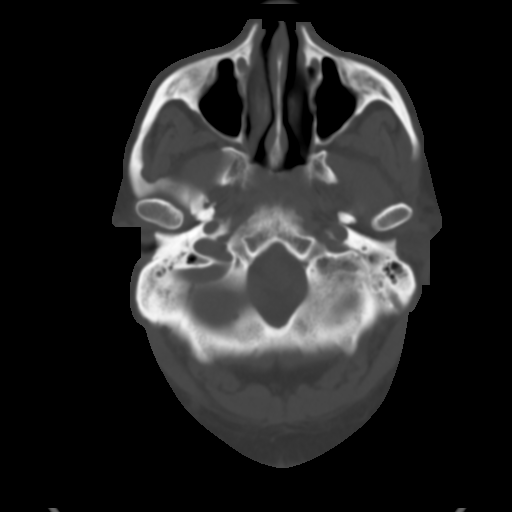
[im 9/36  brain]
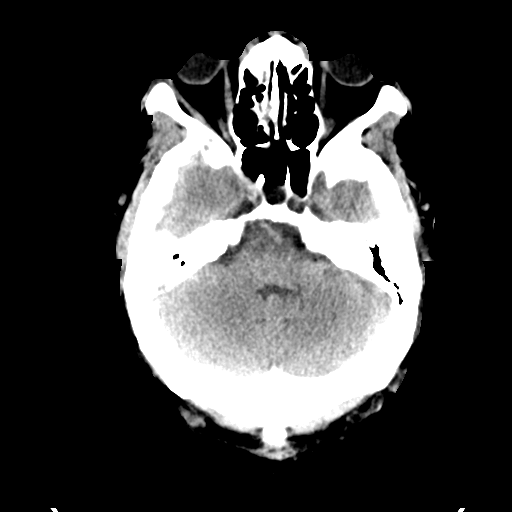
[im 14/36  brain]
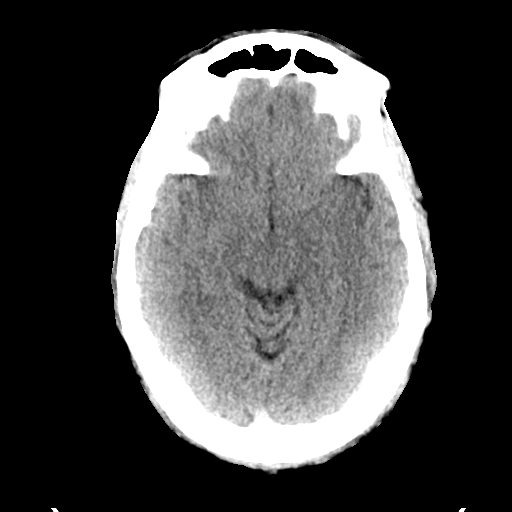
[im 18/36  brain]
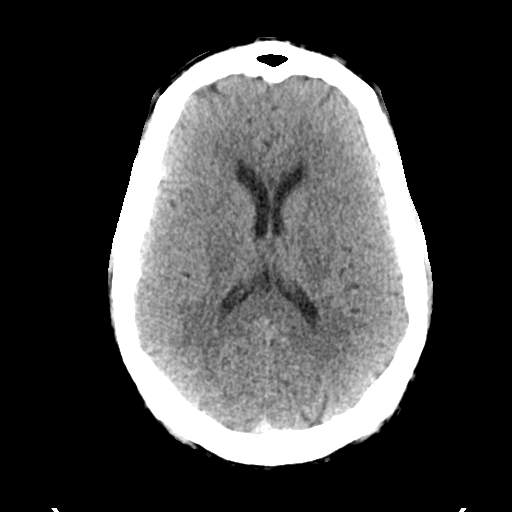
[im 22/36  brain]
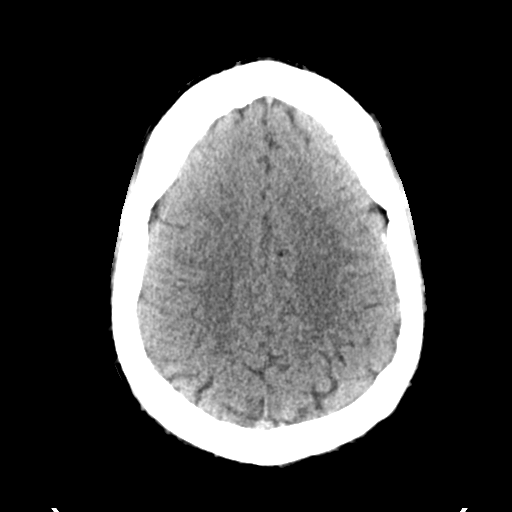
[im 22/36  bone]
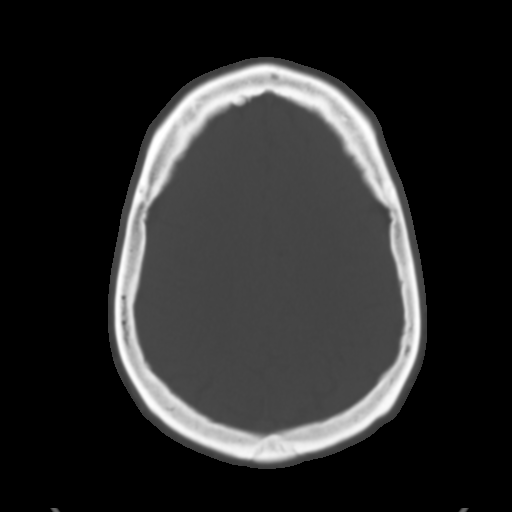
[im 27/36  brain]
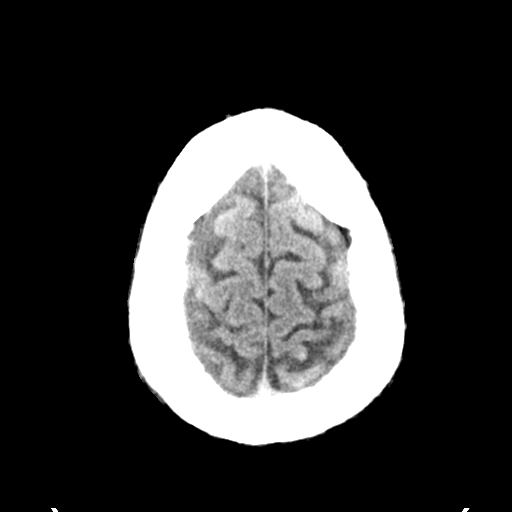
[im 31/36  brain]
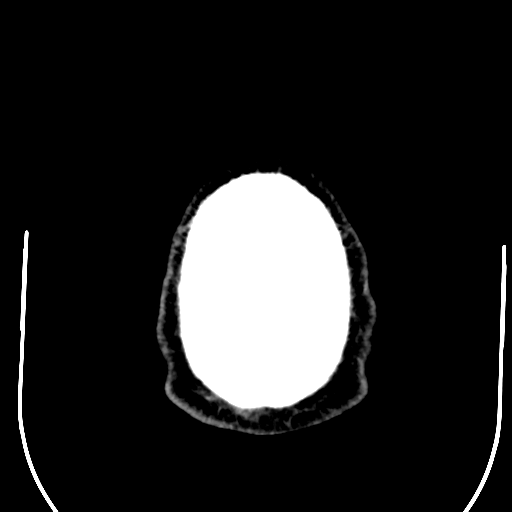

[Series 4: head bone · axial · 0.46mm/px · z∈[+124,+160]mm · 3 of 88 slices shown]
[im 9/88  bone]
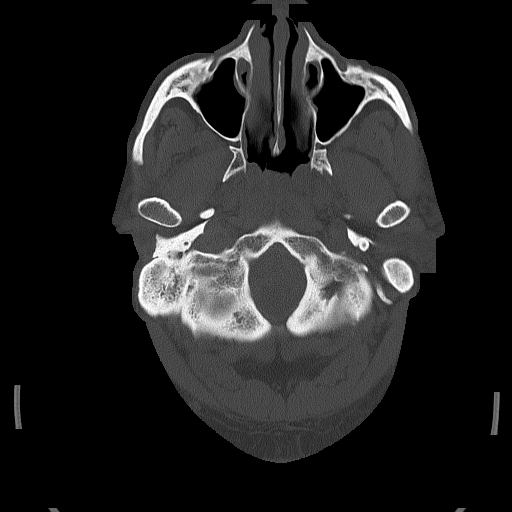
[im 18/88  bone]
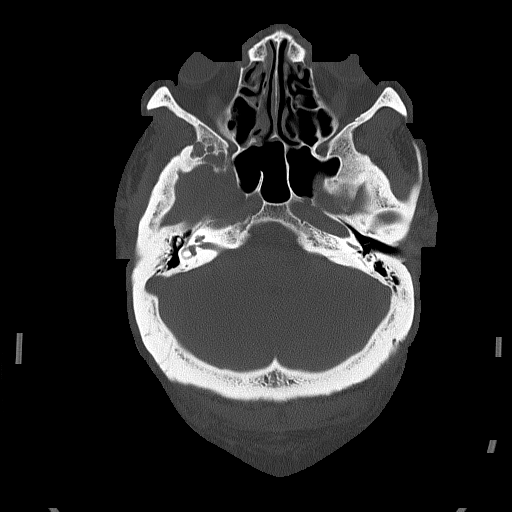
[im 27/88  bone]
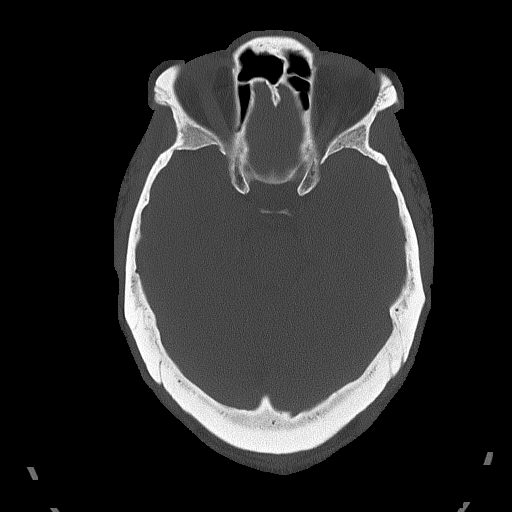

[Series 5: head without cor · coronal · non-contrast · 0.36mm/px · 3 of 77 slices shown]
[im 26/77  brain]
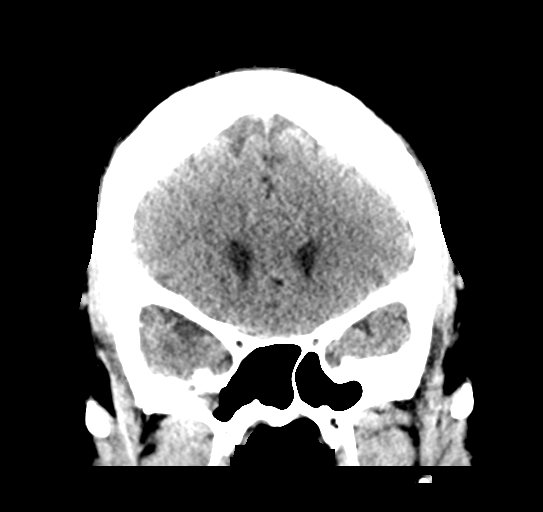
[im 34/77  brain]
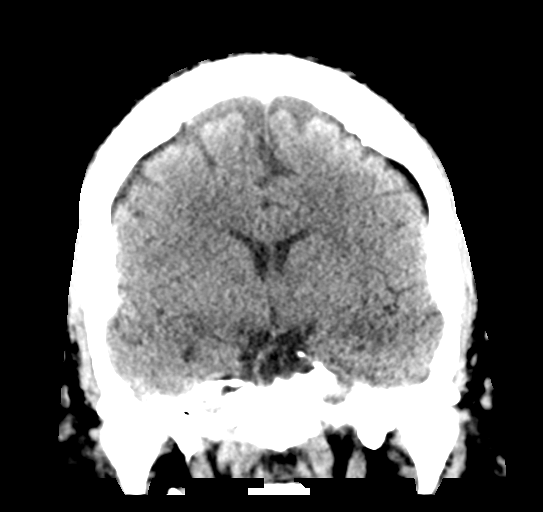
[im 43/77  brain]
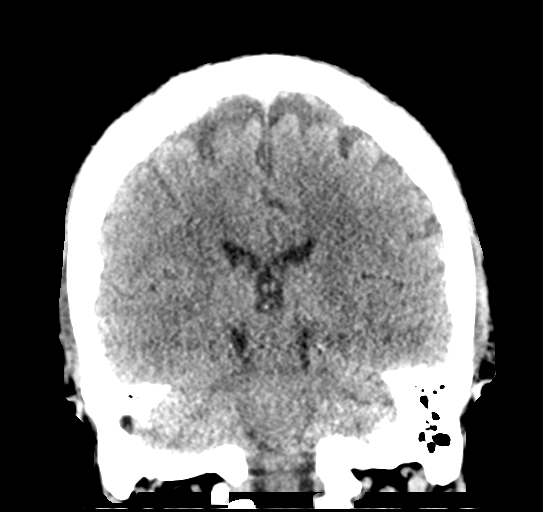

[Series 6: head without sag · sagittal · non-contrast · 0.37mm/px · 3 of 61 slices shown]
[im 21/61  brain]
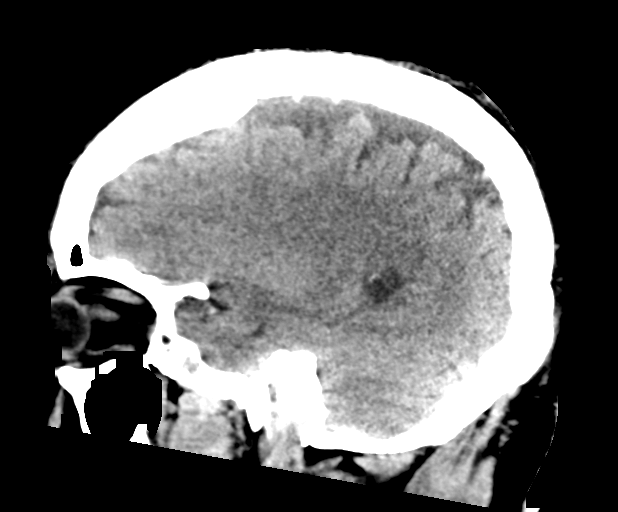
[im 31/61  brain]
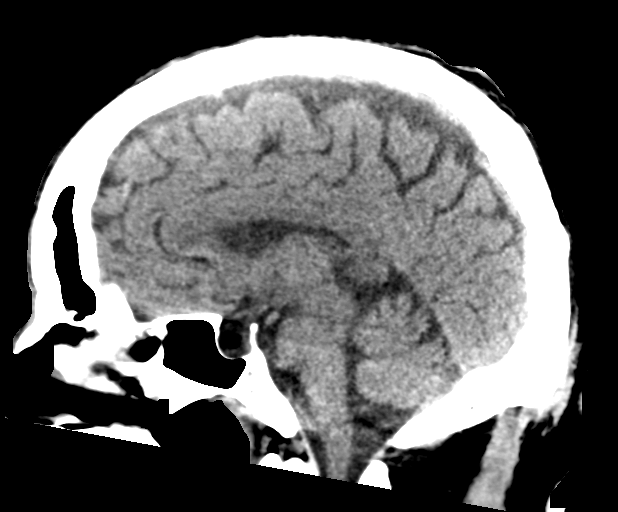
[im 41/61  brain]
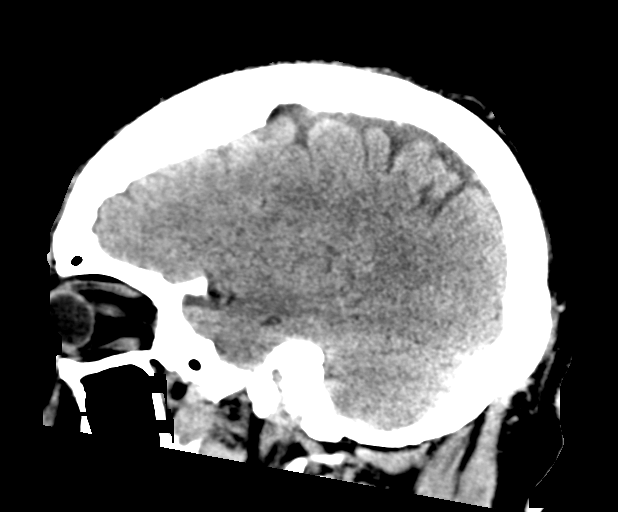

[16 of 47 positions shown; findings below may reference images not displayed]

FINDINGS: Brain: No evidence of acute infarction, hemorrhage, hydrocephalus,
extra-axial collection or mass lesion/mass effect.

The posterior fossa, including the cerebellum, brainstem and fourth
ventricle, is within normal limits. The third and lateral
ventricles, and basal ganglia are unremarkable in appearance. The
cerebral hemispheres are symmetric in appearance, with normal
gray-white differentiation. No mass effect or midline shift is seen.

Vascular: No hyperdense vessel or unexpected calcification.

Skull: There is no evidence of fracture; visualized osseous
structures are unremarkable in appearance.

Sinuses/Orbits: The orbits are within normal limits. The paranasal
sinuses and mastoid air cells are well-aerated.

Other: No significant soft tissue abnormalities are seen.
IMPRESSION: Unremarkable noncontrast CT of the head.

## 2019-08-03 DIAGNOSIS — E119 Type 2 diabetes mellitus without complications: Secondary | ICD-10-CM | POA: Diagnosis not present

## 2019-08-03 DIAGNOSIS — H25813 Combined forms of age-related cataract, bilateral: Secondary | ICD-10-CM | POA: Diagnosis not present

## 2020-03-08 ENCOUNTER — Encounter: Payer: Self-pay | Admitting: Internal Medicine

## 2020-03-08 ENCOUNTER — Ambulatory Visit: Payer: Medicare Other | Attending: Internal Medicine | Admitting: Internal Medicine

## 2020-03-08 ENCOUNTER — Other Ambulatory Visit: Payer: Self-pay

## 2020-03-08 VITALS — BP 121/75 | HR 74 | Resp 16 | Ht 71.0 in | Wt 287.2 lb

## 2020-03-08 DIAGNOSIS — Z2821 Immunization not carried out because of patient refusal: Secondary | ICD-10-CM | POA: Diagnosis not present

## 2020-03-08 DIAGNOSIS — Z87891 Personal history of nicotine dependence: Secondary | ICD-10-CM | POA: Diagnosis not present

## 2020-03-08 DIAGNOSIS — Z6841 Body Mass Index (BMI) 40.0 and over, adult: Secondary | ICD-10-CM

## 2020-03-08 DIAGNOSIS — R7303 Prediabetes: Secondary | ICD-10-CM | POA: Diagnosis not present

## 2020-03-08 DIAGNOSIS — F17210 Nicotine dependence, cigarettes, uncomplicated: Secondary | ICD-10-CM | POA: Diagnosis not present

## 2020-03-08 DIAGNOSIS — Z125 Encounter for screening for malignant neoplasm of prostate: Secondary | ICD-10-CM

## 2020-03-08 DIAGNOSIS — L602 Onychogryphosis: Secondary | ICD-10-CM

## 2020-03-08 DIAGNOSIS — E782 Mixed hyperlipidemia: Secondary | ICD-10-CM

## 2020-03-08 DIAGNOSIS — G6289 Other specified polyneuropathies: Secondary | ICD-10-CM

## 2020-03-08 DIAGNOSIS — Z7189 Other specified counseling: Secondary | ICD-10-CM

## 2020-03-08 NOTE — Patient Instructions (Signed)

## 2020-03-08 NOTE — Progress Notes (Signed)
Patient ID: Justin [name], male    DOB: 21-Nov-1955  MRN: [id number]  CC: Hypertension   Subjective: Justin [name] is a 64 y.o. male who presents for chronic ds management His concerns today include:  Pt with hx of HTN, HL, tob dep, preDM, obesity, primary OA knees, chronic LBP with surgery 2005  Obesity/PreDM: had visit around 09/2019 from RN sent by his insurance.  Reports wgh was 330 lbs. His goal is to get to 250 lbs -trying to eat healthier - eating lots salad and less fried foods -plans to join the gym.  Just move to a new apartment  that does not have gym on site like his last apartment complex -Saw Dr. Schuyler Amor 05/2019.  Told he has cataracts.  He has a follow-up appointment with him coming up this October.  C/o itching in legs and some times feeling of needle sticks in legs.  Occurs if he stands to long or thinks it may be coming from his back.   HYPERTENSION -not on meds.  Limits salt.  No CP/SOB/LE edema  HL: He had increased lipid profile and atherosclerotic cardiovascular disease risk score on last visit.  I had recommended starting Lipitor.  However patient states he never got the message.  He is not taking it because of this.  Tob dep: quit over 2 yrs.  HM:  Has not taken the COVID as yet.  He wonders whether it is safe to do so.  Declines Prevnar 13  Patient Active Problem List   Diagnosis Date Noted  . Primary osteoarthritis of both knees 12/21/2017  . Mixed hyperlipidemia 02/23/2017  . Prediabetes 02/18/2017  . Essential hypertension 12/27/2016  . Tobacco abuse 03/03/2016  . Lumbosacral spondylosis without myelopathy 03/08/2014  . Lumbar post-laminectomy syndrome 03/08/2014  . Dyslipidemia 06/06/2013  . Obesity (BMI 30-39.9) 06/06/2013  . Low back pain 11/17/2012  . Lumbago 11/17/2012     Current Outpatient Medications on File Prior to Visit  Medication Sig Dispense Refill  . atorvastatin (LIPITOR) 10 MG tablet Take 1 tablet (10 mg total) by mouth daily. 90  tablet 3  . tamsulosin (FLOMAX) 0.4 MG CAPS capsule Take 1 capsule (0.4 mg total) by mouth daily. 30 capsule 3   No current facility-administered medications on file prior to visit.    No Known Allergies  Social History   Socioeconomic History  . Marital status: Single    Spouse name: Not on file  . Number of children: Not on file  . Years of education: Not on file  . Highest education level: Not on file  Occupational History  . Not on file  Tobacco Use  . Smoking status: Current Every Day Smoker    Packs/day: 0.25    Types: Cigarettes  . Smokeless tobacco: Never Used  Vaping Use  . Vaping Use: Never used  Substance and Sexual Activity  . Alcohol use: Yes    Alcohol/week: 2.0 - 3.0 standard drinks    Types: 2 - 3 Cans of beer per week    Comment: occasional  . Drug use: Not on file  . Sexual activity: Not on file  Other Topics Concern  . Not on file  Social History Narrative  . Not on file   Social Determinants of Health   Financial Resource Strain:   . Difficulty of Paying Living Expenses:   Food Insecurity:   . Worried About Charity fundraiser in the Last Year:   . Arboriculturist in  the Last Year:   Transportation Needs:   . Film/video editor (Medical):   Marland Kitchen Lack of Transportation (Non-Medical):   Physical Activity:   . Days of Exercise per Week:   . Minutes of Exercise per Session:   Stress:   . Feeling of Stress :   Social Connections:   . Frequency of Communication with Friends and Family:   . Frequency of Social Gatherings with Friends and Family:   . Attends Religious Services:   . Active Member of Clubs or Organizations:   . Attends Archivist Meetings:   Marland Kitchen Marital Status:   Intimate Partner Violence:   . Fear of Current or Ex-Partner:   . Emotionally Abused:   Marland Kitchen Physically Abused:   . Sexually Abused:     Family History  Problem Relation Age of Onset  . Colon cancer Father   . Heart disease Father   . Heart disease Mother      Past Surgical History:  Procedure Laterality Date  . BACK SURGERY  06/24/2004   [facility]-   . MULTIPLE EXTRACTIONS WITH ALVEOLOPLASTY N/A 07/03/2017   Procedure: MULTIPLE EXTRACTION WITH ALVEOLOPLASTY;  Surgeon: Diona Browner, DDS;  Location: [place];  Service: Oral Surgery;  Laterality: N/A;  . TONSILLECTOMY    . UMBILICAL HERNIA REPAIR      ROS: Review of Systems Negative except as stated above  PHYSICAL EXAM: BP 121/75   Pulse 74   Resp 16   Ht 5\' 11"  (1.803 m)   Wt 287 lb 3.2 oz (130.3 kg)   SpO2 97%   BMI 40.06 kg/m   Wt Readings from Last 3 Encounters:  03/08/20 287 lb 3.2 oz (130.3 kg)  12/21/17 284 lb 12.8 oz (129.2 kg)  07/03/17 272 lb (123.4 kg)    Physical Exam General appearance - alert, well appearing, and in no distress Mental status - normal mood, behavior, speech, dress, motor activity, and thought processes Mouth - mucous membranes moist, pharynx normal without lesions Neck - supple, no significant adenopathy Chest - clear to auscultation, no wheezes, rales or rhonchi, symmetric air entry Heart - normal rate, regular rhythm, normal S1, S2, no murmurs, rubs, clicks or gallops Extremities - peripheral pulses normal, no pedal edema, no clubbing or cyanosis  Depression screen [facility] 2/9 03/08/2020 03/08/2020 12/21/2017  Decreased Interest 0 0 (No Data)  Down, Depressed, Hopeless 0 0 1  PHQ - 2 Score 0 0 1  Altered sleeping - - (No Data)  Tired, decreased energy - - 1  Change in appetite - - 2  Feeling bad or failure about yourself  - - 1  Trouble concentrating - - 0  Moving slowly or fidgety/restless - - (No Data)  Suicidal thoughts - - 0  PHQ-9 Score - - -  Difficult doing work/chores - - -    CMP Latest Ref Rng & Units 07/01/2017 02/18/2017 12/26/2016  Glucose 65 - 99 mg/dL 119(H) 106(H) 104(H)  BUN 6 - 20 mg/dL 15 14 16   Creatinine 0.61 - 1.24 mg/dL 1.11 0.98 0.85  Sodium 135 - 145 mmol/L 140 141 139  Potassium 3.5 - 5.1 mmol/L 4.0 4.4 4.3  Chloride 101  - 111 mmol/L 110 105 101  CO2 22 - 32 mmol/L 23 19(L) 23  Calcium 8.9 - 10.3 mg/dL 8.9 9.5 9.2  Total Protein 6.5 - 8.1 g/dL 5.8(L) - -  Total Bilirubin 0.3 - 1.2 mg/dL 0.4 - -  Alkaline Phos 38 - 126 U/L 74 - -  AST 15 - 41 U/L 20 - -  ALT 17 - 63 U/L 17 - -   Lipid Panel     Component Value Date/Time   CHOL 224 (H) 12/21/2017 1432   TRIG 171 (H) 12/21/2017 1432   HDL 51 12/21/2017 1432   CHOLHDL 4.4 12/21/2017 1432   CHOLHDL 4.2 03/03/2016 1521   VLDL 36 (H) 03/03/2016 1521   LDLCALC 139 (H) 12/21/2017 1432    CBC    Component Value Date/Time   WBC 6.0 07/01/2017 1311   RBC 4.58 07/01/2017 1311   HGB 13.5 07/01/2017 1311   HGB 14.5 02/18/2017 1115   HCT 40.5 07/01/2017 1311   HCT 44.6 02/18/2017 1115   PLT 179 07/01/2017 1311   PLT 228 02/18/2017 1115   MCV 88.4 07/01/2017 1311   MCV 92 02/18/2017 1115   MCH 29.5 07/01/2017 1311   MCHC 33.3 07/01/2017 1311   RDW 14.1 07/01/2017 1311   RDW 14.5 02/18/2017 1115   LYMPHSABS 3.6 (H) 02/18/2017 1115   MONOABS 680 03/03/2016 1521   EOSABS 0.1 02/18/2017 1115   BASOSABS 0.1 02/18/2017 1115    ASSESSMENT AND PLAN: 1. Prediabetes Dietary counseling given.  Advised patient to eliminate sugary drinks, eat smaller portions of white carbohydrates, eat more lean white meat instead of red meat and incorporate fresh fruits and vegetables into the diet.  Encouraged him to get in some form of moderate intensity exercise at least 3 to 4 days a week for 30 minutes - Hemoglobin A1c - Microalbumin / creatinine urine ratio  2. Morbid obesity with BMI of 40.0-44.9, adult ([place]) Commended him on reported weight loss.  See #1 above. - CBC - Comprehensive metabolic panel  3. Mixed hyperlipidemia Recheck lipid profile.  If still not at goal with elevated ASCVD score, will recommend that he start Lipitor - Lipid panel  4. Former smoker Commended him on quitting.  Encouraged him to remain tobacco free.  Less than 5 minutes spent on  counseling.  5. Pneumococcal vaccination declined This was offered and patient declined  6. Tetanus, diphtheria, and acellular pertussis (Tdap) vaccination declined This was offered and patient declined  7. Prostate cancer screening Discussed prostate cancer screening.  Patient willing to be screened - PSA  8. Educated about COVID-19 virus infection Encourage patient to get [employer] or [employer] COVID-19 vaccine.  Went over possible side effects of the medication including soreness at the injection site, low-grade fever, body aches and headaches which resolved with an NSAID or Tylenol  9. Other polyneuropathy - Vitamin B12  10. Overgrown toenails - Ambulatory referral to Podiatry   Patient was given the opportunity to ask questions.  Patient verbalized understanding of the plan and was able to repeat key elements of the plan.   Orders Placed This Encounter  Procedures  . Hemoglobin A1c  . Microalbumin / creatinine urine ratio  . CBC  . Comprehensive metabolic panel  . Lipid panel  . PSA  . Vitamin B12  . Ambulatory referral to Podiatry     Requested Prescriptions    No prescriptions requested or ordered in this encounter    Return in about 4 months (around 07/09/2020) for 3 weeks with Durene Fruits for [employer].  Karle Plumber, MD, FACP

## 2020-03-09 LAB — HEMOGLOBIN A1C
Est. average glucose Bld gHb Est-mCnc: 154 mg/dL
Hgb A1c MFr Bld: 7 % — ABNORMAL HIGH (ref 4.8–5.6)

## 2020-03-09 LAB — MICROALBUMIN / CREATININE URINE RATIO
Creatinine, Urine: 74.8 mg/dL
Microalb/Creat Ratio: 10 mg/g creat (ref 0–29)
Microalbumin, Urine: 7.4 ug/mL

## 2020-03-09 LAB — COMPREHENSIVE METABOLIC PANEL
ALT: 34 IU/L (ref 0–44)
AST: 18 IU/L (ref 0–40)
Albumin/Globulin Ratio: 1.7 (ref 1.2–2.2)
Albumin: 4.3 g/dL (ref 3.8–4.8)
Alkaline Phosphatase: 101 IU/L (ref 48–121)
BUN/Creatinine Ratio: 13 (ref 10–24)
BUN: 14 mg/dL (ref 8–27)
Bilirubin Total: 0.3 mg/dL (ref 0.0–1.2)
CO2: 21 mmol/L (ref 20–29)
Calcium: 9.6 mg/dL (ref 8.6–10.2)
Chloride: 103 mmol/L (ref 96–106)
Creatinine, Ser: 1.09 mg/dL (ref 0.76–1.27)
GFR calc Af Amer: 81 mL/min/{1.73_m2} (ref >59)
GFR calc non Af Amer: 70 mL/min/{1.73_m2} (ref >59)
Globulin, Total: 2.5 g/dL (ref 1.5–4.5)
Glucose: 144 mg/dL — ABNORMAL HIGH (ref 65–99)
Potassium: 4.4 mmol/L (ref 3.5–5.2)
Sodium: 138 mmol/L (ref 134–144)
Total Protein: 6.8 g/dL (ref 6.0–8.5)

## 2020-03-09 LAB — CBC
Hematocrit: 43 % (ref 37.5–51.0)
Hemoglobin: 13.9 g/dL (ref 13.0–17.7)
MCH: 29.8 pg (ref 26.6–33.0)
MCHC: 32.3 g/dL (ref 31.5–35.7)
MCV: 92 fL (ref 79–97)
Platelets: 210 10*3/uL (ref 150–450)
RBC: 4.66 x10E6/uL (ref 4.14–5.80)
RDW: 13.2 % (ref 11.6–15.4)
WBC: 6.6 10*3/uL (ref 3.4–10.8)

## 2020-03-09 LAB — LIPID PANEL
Chol/HDL Ratio: 3.9 ratio (ref 0.0–5.0)
Cholesterol, Total: 217 mg/dL — ABNORMAL HIGH (ref 100–199)
HDL: 55 mg/dL (ref >39)
LDL Chol Calc (NIH): 136 mg/dL — ABNORMAL HIGH (ref 0–99)
Triglycerides: 149 mg/dL (ref 0–149)
VLDL Cholesterol Cal: 26 mg/dL (ref 5–40)

## 2020-03-09 LAB — VITAMIN B12: Vitamin B-12: 583 pg/mL (ref 232–1245)

## 2020-03-09 LAB — PSA: Prostate Specific Ag, Serum: 0.6 ng/mL (ref 0.0–4.0)

## 2020-03-11 ENCOUNTER — Other Ambulatory Visit: Payer: Self-pay | Admitting: Internal Medicine

## 2020-03-11 DIAGNOSIS — E119 Type 2 diabetes mellitus without complications: Secondary | ICD-10-CM

## 2020-03-11 DIAGNOSIS — Z794 Long term (current) use of insulin: Secondary | ICD-10-CM | POA: Insufficient documentation

## 2020-03-11 MED ORDER — METFORMIN HCL 500 MG PO TABS: 500.0000 mg | ORAL_TABLET | Freq: Every day | ORAL | 1 refills | Status: DC

## 2020-03-11 NOTE — Progress Notes (Signed)
Let patient know that screening test shows that he is in the range for diabetes.  I recommend starting a low-dose of a medication called Metformin.  Scription sent to his pharmacy.  Healthy eating habits and regular exercise will also help in controlling diabetes.  Urine revealed no significant protein buildup which is good.  Blood count reveals no anemia.  Kidney and liver function tests are normal.  LDL cholesterol is one hundred and thirty-six with normal being less than seventy.  High cholesterol can increase risk for heart attack and strokes.  Take the atorvastatin as prescribed.  Screening test for prostate cancer is normal.  Vitamin B12 level is normal.

## 2020-03-12 ENCOUNTER — Telehealth: Payer: Self-pay

## 2020-03-12 NOTE — Telephone Encounter (Signed)
Contacted pt to go over lab results pt is aware and doesn't have any questions or concerns 

## 2020-03-14 ENCOUNTER — Encounter: Payer: Self-pay | Admitting: Internal Medicine

## 2020-04-10 ENCOUNTER — Encounter: Payer: Medicare Other | Admitting: Family

## 2020-05-01 ENCOUNTER — Encounter: Payer: Medicare Other | Admitting: Family

## 2020-05-23 ENCOUNTER — Ambulatory Visit: Payer: Medicare Other | Admitting: Podiatry

## 2020-06-04 ENCOUNTER — Ambulatory Visit: Payer: Medicare Other | Admitting: Family

## 2020-06-21 ENCOUNTER — Other Ambulatory Visit: Payer: Self-pay

## 2020-06-21 ENCOUNTER — Ambulatory Visit: Payer: Medicare Other | Attending: Family | Admitting: Family

## 2020-06-21 ENCOUNTER — Encounter: Payer: Self-pay | Admitting: Family

## 2020-06-21 VITALS — BP 135/85 | HR 65 | Temp 96.3°F | Resp 20 | Ht 71.0 in | Wt 281.8 lb

## 2020-06-21 DIAGNOSIS — Z0000 Encounter for general adult medical examination without abnormal findings: Secondary | ICD-10-CM

## 2020-06-21 DIAGNOSIS — Z5320 Procedure and treatment not carried out because of patient's decision for unspecified reasons: Secondary | ICD-10-CM

## 2020-06-21 DIAGNOSIS — Z2821 Immunization not carried out because of patient refusal: Secondary | ICD-10-CM | POA: Diagnosis not present

## 2020-06-21 NOTE — Patient Instructions (Signed)
  Justin [name] , Thank you for taking time to come for your Medicare Wellness Visit. I appreciate your ongoing commitment to your health goals. Please review the following plan we discussed and let me know if I can assist you in the future.   These are the goals we discussed: Goals   None     This is a list of the screening recommended for you and due dates:  Health Maintenance  Topic Date Due  . Complete foot exam   Never done  . Eye exam for diabetics  02/23/2020  . COVID-19 Vaccine (1) 07/07/2020*  . Flu Shot  11/22/2020*  . Tetanus Vaccine  03/08/2021*  . Pneumonia vaccines (1 of 2 - PCV13) 03/08/2021*  . Hemoglobin A1C  09/08/2020  . Colon Cancer Screening  10/15/2020  . Urine Protein Check  03/08/2021  .  Hepatitis C: One time screening is recommended by Center for Disease Control  (CDC) for  adults born from [birth year] through 1967.   Completed  *Topic was postponed. The date shown is not the original due date.

## 2020-06-21 NOTE — Progress Notes (Signed)
Reports not currently taking any prescribed medications   Due for eye exam- has appt on 08/08/20 w/ Dr. Rolena Infante

## 2020-06-21 NOTE — Progress Notes (Signed)
Subjective:   Justin [name] is a 64 y.o. male who presents for an Initial Medicare Annual Wellness Visit.  Review of Systems    Cardiac Risk Factors include: male gender;dyslipidemia;advanced age (>26men, >55 women);family history of premature cardiovascular disease     Objective:    Today's Vitals   06/21/20 1428  BP: 135/85  Pulse: 65  Resp: 20  Temp: (!) 96.3 F (35.7 C)  TempSrc: Temporal  SpO2: 98%  Weight: 281 lb 12.8 oz (127.8 kg)  Height: 5\' 11"  (1.803 m)   Body mass index is 39.3 kg/m.  Advanced Directives 06/21/2020 07/01/2017 05/07/2017 03/12/2017 03/03/2017 11/20/2016 03/03/2016  Does Patient Have a Medical Advance Directive? No No No No No No No  Would patient like information on creating a medical advance directive? - No - Patient declined - - - - -    Current Medications (verified) Outpatient Encounter Medications as of 06/21/2020  Medication Sig  . atorvastatin (LIPITOR) 10 MG tablet Take 1 tablet (10 mg total) by mouth daily. (Patient not taking: Reported on 06/21/2020)  . metFORMIN (GLUCOPHAGE) 500 MG tablet Take 1 tablet (500 mg total) by mouth daily with breakfast. (Patient not taking: Reported on 06/21/2020)  . tamsulosin (FLOMAX) 0.4 MG CAPS capsule Take 1 capsule (0.4 mg total) by mouth daily. (Patient not taking: Reported on 06/21/2020)   No facility-administered encounter medications on file as of 06/21/2020.    Allergies (verified) Patient has no known allergies.   History: Past Medical History:  Diagnosis Date  . Arthritis    states he was given med. for inflammation in his "bones" but he isn't taking it right now, remarks " i'm not going to to take something they just throw at me for no reason"  . Dyspnea    pt. reports this SOB worsens wioth increased heat   . Hypercholesteremia   . Pre-diabetes    pre diabetes    Past Surgical History:  Procedure Laterality Date  . BACK SURGERY  06/24/2004   [facility]-   . MULTIPLE EXTRACTIONS WITH  ALVEOLOPLASTY N/A 07/03/2017   Procedure: MULTIPLE EXTRACTION WITH ALVEOLOPLASTY;  Surgeon: Diona Browner, DDS;  Location: [place];  Service: Oral Surgery;  Laterality: N/A;  . TONSILLECTOMY    . UMBILICAL HERNIA REPAIR     Family History  Problem Relation Age of Onset  . Colon cancer Father   . Heart disease Father   . Heart disease Mother    Social History   Socioeconomic History  . Marital status: Single    Spouse name: Not on file  . Number of children: Not on file  . Years of education: Not on file  . Highest education level: Not on file  Occupational History  . Not on file  Tobacco Use  . Smoking status: Former Smoker    Packs/day: 0.25    Types: Cigarettes  . Smokeless tobacco: Never Used  Vaping Use  . Vaping Use: Never used  Substance and Sexual Activity  . Alcohol use: Yes    Alcohol/week: 2.0 - 3.0 standard drinks    Types: 2 - 3 Cans of beer per week    Comment: occasional  . Drug use: Not on file  . Sexual activity: Not on file  Other Topics Concern  . Not on file  Social History Narrative  . Not on file   Social Determinants of Health   Financial Resource Strain:   . Difficulty of Paying Living Expenses: Not on file  Food  Insecurity:   . Worried About Charity fundraiser in the Last Year: Not on file  . Ran Out of Food in the Last Year: Not on file  Transportation Needs:   . Lack of Transportation (Medical): Not on file  . Lack of Transportation (Non-Medical): Not on file  Physical Activity:   . Days of Exercise per Week: Not on file  . Minutes of Exercise per Session: Not on file  Stress:   . Feeling of Stress : Not on file  Social Connections:   . Frequency of Communication with Friends and Family: Not on file  . Frequency of Social Gatherings with Friends and Family: Not on file  . Attends Religious Services: Not on file  . Active Member of Clubs or Organizations: Not on file  . Attends Archivist Meetings: Not on file  . Marital  Status: Not on file    Tobacco Counseling Counseling given: Not Answered - Quit smoking 2 years ago. Began smoking in 1977 about 1 pack would last 3 days.  Clinical Intake: Pain : No/denies pain   Diabetes: No  How often do you need to have someone help you when you read instructions, pamphlets, or other written materials from your doctor or pharmacy?: 1 - Never  Diabetic? Pre-diabetic and taking Metformin.  Interpreter Needed?: No  Activities of Daily Living In your present state of health, do you have any difficulty performing the following activities: 06/21/2020  Hearing? N  Vision? Y  Difficulty concentrating or making decisions? N  Walking or climbing stairs? N  Dressing or bathing? N  Doing errands, shopping? N  Preparing Food and eating ? N  Using the Toilet? N  In the past six months, have you accidently leaked urine? N  Do you have problems with loss of bowel control? N  Managing your Medications? N  Managing your Finances? N  Housekeeping or managing your Housekeeping? N  Some recent data might be hidden  - Vision challenges: lost both pair of glasses recently while he was moving from one apartment to the next. Has appointment scheduled with Dr. Katy Fitch 08/08/2020. Wearing glasses purchased over-the-counter/reading glasses.  Patient Care Team: Ladell Pier, MD as PCP - General (Internal Medicine) Meredith Staggers, MD as Consulting Physician (Physical Medicine and Rehabilitation)  Clent Jacks, as Consulting Physician (Ophthalmology) Seeing dentist and cannot recall her name, says he had all of his teeth extracted in 2020. Given dentures and doesn't wear them because they are uncomfortable.  Indicate any recent Medical Services you may have received from other than [facility] providers in the past year (date may be approximate).     Assessment:   This is a routine wellness examination for [employer].  Hearing/Vision screen No exam data present  Dietary issues and  exercise activities discussed: Current Exercise Habits: The patient does not participate in regular exercise at present, Exercise limited by: orthopedic condition(s)    Goals   None   "I want to loose weight."  Depression Screen PHQ 2/9 Scores 06/21/2020 03/08/2020 03/08/2020 12/21/2017 03/12/2017 12/26/2016 03/03/2016  PHQ - 2 Score 0 0 0 1 2 0 1  PHQ- 9 Score - - - - 10 - 7    Fall Risk Fall Risk  06/21/2020 03/08/2020 12/21/2017 12/26/2016 02/12/2015  Falls in the past year? 1 0 No No No  Number falls in past yr: 0 0 - - -  Injury with Fall? 0 1 - - -   Any stairs in or  around the home? No  If so, are there any without handrails? Not applicable Home free of loose throw rugs in walkways, pet beds, electrical cords, etc? Yes  Adequate lighting in your home to reduce risk of falls? Yes   ASSISTIVE DEVICES UTILIZED TO PREVENT FALLS: Life alert? No  Use of a cane, walker or w/c? cane sometimes Grab bars in the bathroom? Yes  Shower chair or bench in shower? No  Elevated toilet seat or a handicapped toilet? No   TIMED UP AND GO: Was the test performed? Yes .  Length of time to ambulate 10 feet: 8 sec.   Gait steady and fast without use of assistive device  Cognitive Function: MMSE - Mini Mental State Exam 06/21/2020  Orientation to time 4  Orientation to Place 5  Registration 3  Attention/ Calculation 5  Recall 3  Language- name 2 objects 2  Language- repeat 1  Language- follow 3 step command 3  Language- read & follow direction 1  Write a sentence 1  Copy design 1  Total score 29    Immunizations  There is no immunization history on file for this patient.  TDAP status: Due, Education has been provided regarding the importance of this vaccine. Advised may receive this vaccine at local pharmacy or Health Dept. Aware to provide a copy of the vaccination record if obtained from local pharmacy or Health Dept. Verbalized acceptance and understanding. Flu Vaccine status:  Declined, Education has been provided regarding the importance of this vaccine but patient still declined. Advised may receive this vaccine at local pharmacy or Health Dept. Aware to provide a copy of the vaccination record if obtained from local pharmacy or Health Dept. Verbalized acceptance and understanding. Pneumococcal vaccine status: Declined,  Education has been provided regarding the importance of this vaccine but patient still declined. Advised may receive this vaccine at local pharmacy or Health Dept. Aware to provide a copy of the vaccination record if obtained from local pharmacy or Health Dept. Verbalized acceptance and understanding.  Covid-19 vaccine status: Declined, Education has been provided regarding the importance of this vaccine but patient still declined. Advised may receive this vaccine at local pharmacy or Health Dept.or vaccine clinic. Aware to provide a copy of the vaccination record if obtained from local pharmacy or Health Dept. Verbalized acceptance and understanding.  Qualifies for Shingles Vaccine? Yes, declined Shingrix Completed?: No.    Education has been provided regarding the importance of this vaccine. Patient has been advised to call insurance company to determine out of pocket expense if they have not yet received this vaccine. Advised may also receive vaccine at local pharmacy or Health Dept. Verbalized acceptance and understanding.  Screening Tests Health Maintenance  Topic Date Due  . FOOT EXAM  Never done  . COVID-19 Vaccine (1) Never done  . OPHTHALMOLOGY EXAM  02/23/2020  . INFLUENZA VACCINE  Never done  . TETANUS/TDAP  03/08/2021 (Originally 02/12/1975)  . PNA vac Low Risk Adult (1 of 2 - PCV13) 03/08/2021 (Originally 02/13/2019)  . HEMOGLOBIN A1C  09/08/2020  . COLONOSCOPY  10/15/2020  . URINE MICROALBUMIN  03/08/2021  . Hepatitis C Screening  Completed   Health Maintenance  Health Maintenance Due  Topic Date Due  . FOOT EXAM  Never done  .  COVID-19 Vaccine (1) Never done  . OPHTHALMOLOGY EXAM  02/23/2020  . INFLUENZA VACCINE  Never done    Colorectal cancer screening: Completed 10/16/2015. Repeat every 5 years  Lung Cancer Screening: (Low Dose  CT Chest recommended if Age 62-80 years, 30 pack-year currently smoking OR have quit w/in 15years.) does qualify.   Lung Cancer Screening Referral: declined  Additional Screening:  Hepatitis C Screening: does not qualify; Completed 11/24/2017  Vision Screening: Recommended annual ophthalmology exams for early detection of glaucoma and other disorders of the eye. Is the patient up to date with their annual eye exam?  Yes, and appointment scheduled for December 2021 Who is the provider or what is the name of the office in which the patient attends annual eye exams? Dr. Katy Fitch  If pt is not established with a provider, would they like to be referred to a provider to establish care? not applicable.   Dental Screening: Recommended annual dental exams for proper oral hygiene  Community Resource Referral / Chronic Care Management: CRR required this visit?  No   CCM required this visit?  No      Plan:  1. Encounter for Medicare annual wellness exam: - Patient presents for Medicare annual wellness exam. - Discussed with patient what is a Soil scientist. I went over with him Living Will and [place].  Patient given written packet also. He will look over it and consider executing a Living Will.  If he does, I requested that he brings a copy of it for our records. - Keep all scheduled appointments with primary physician.  2. Tetanus, diphtheria, and acellular pertussis (Tdap) vaccination declined: - Declined.  3. Influenza vaccination declined by patient: - Declined.  4. Pneumococcal vaccination declined: - Declined.  5. COVID-19 vaccine dose declined: - Declined.  6. Herpes zoster vaccination declined: - Declined.  7. Lung cancer screening  declined by patient: - Declined.   I have personally reviewed and noted the following in the patient's chart:   . Medical and social history . Use of alcohol, tobacco or illicit drugs  . Current medications and supplements . Functional ability and status . Nutritional status . Physical activity . Advanced directives . List of other physicians . Hospitalizations, surgeries, and ER visits in previous 12 months . Vitals . Screenings to include cognitive, depression, and falls . Referrals and appointments  In addition, I have reviewed and discussed with patient certain preventive protocols, quality metrics, and best practice recommendations. A written personalized care plan for preventive services as well as general preventive health recommendations were provided to patient.     Camillia Herter, NP   06/21/2020

## 2020-07-13 ENCOUNTER — Ambulatory Visit: Payer: Medicare Other | Attending: Internal Medicine | Admitting: Internal Medicine

## 2020-07-13 ENCOUNTER — Other Ambulatory Visit: Payer: Self-pay

## 2020-07-13 DIAGNOSIS — R030 Elevated blood-pressure reading, without diagnosis of hypertension: Secondary | ICD-10-CM | POA: Diagnosis not present

## 2020-07-13 DIAGNOSIS — E782 Mixed hyperlipidemia: Secondary | ICD-10-CM

## 2020-07-13 DIAGNOSIS — G8929 Other chronic pain: Secondary | ICD-10-CM

## 2020-07-13 DIAGNOSIS — M545 Low back pain, unspecified: Secondary | ICD-10-CM

## 2020-07-13 DIAGNOSIS — E1169 Type 2 diabetes mellitus with other specified complication: Secondary | ICD-10-CM | POA: Diagnosis not present

## 2020-07-13 DIAGNOSIS — E669 Obesity, unspecified: Secondary | ICD-10-CM

## 2020-07-13 MED ORDER — ATORVASTATIN CALCIUM 10 MG PO TABS: 10.0000 mg | ORAL_TABLET | Freq: Every day | ORAL | 3 refills | Status: DC

## 2020-07-13 MED ORDER — METFORMIN HCL 500 MG PO TABS: 500.0000 mg | ORAL_TABLET | Freq: Every day | ORAL | 1 refills | Status: DC

## 2020-07-13 MED ORDER — MELOXICAM 15 MG PO TABS: 15.0000 mg | ORAL_TABLET | Freq: Every day | ORAL | 0 refills | Status: DC
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
## 2020-07-13 NOTE — Progress Notes (Signed)
Pt states he has questions regarding the COVID vaccine

## 2020-08-16 ENCOUNTER — Other Ambulatory Visit: Payer: Self-pay | Admitting: Internal Medicine

## 2020-08-16 DIAGNOSIS — G8929 Other chronic pain: Secondary | ICD-10-CM

## 2020-08-16 NOTE — Telephone Encounter (Signed)
Requested medications are due for refill today yes  Requested medications are on the active medication list yes  Last refill 11/19  Last visit 06/2020  Future visit scheduled no  Notes to clinic was only given 30 days rx, unsure if was to be continued.

## 2020-08-22 ENCOUNTER — Ambulatory Visit: Payer: Medicare Other | Admitting: Podiatry

## 2020-09-14 ENCOUNTER — Ambulatory Visit (INDEPENDENT_AMBULATORY_CARE_PROVIDER_SITE_OTHER): Payer: Medicare Other | Admitting: Podiatry

## 2020-09-14 ENCOUNTER — Other Ambulatory Visit: Payer: Self-pay

## 2020-09-14 ENCOUNTER — Encounter: Payer: Self-pay | Admitting: Podiatry

## 2020-09-14 DIAGNOSIS — E119 Type 2 diabetes mellitus without complications: Secondary | ICD-10-CM

## 2020-09-14 DIAGNOSIS — M79675 Pain in left toe(s): Secondary | ICD-10-CM | POA: Diagnosis not present

## 2020-09-14 DIAGNOSIS — Z794 Long term (current) use of insulin: Secondary | ICD-10-CM

## 2020-09-14 DIAGNOSIS — B351 Tinea unguium: Secondary | ICD-10-CM

## 2020-09-14 DIAGNOSIS — M79674 Pain in right toe(s): Secondary | ICD-10-CM

## 2020-09-14 NOTE — Progress Notes (Signed)
This patient returns to my office for at risk foot care.  This patient requires this care by a professional since this patient will be at risk due to having diabetes type 2.  This patient is unable to cut nails himself since the patient cannot reach his nails.These nails are painful walking and wearing shoes.  This patient presents for at risk foot care today.  General Appearance  Alert, conversant and in no acute stress.  Vascular  Dorsalis pedis and posterior tibial  pulses are palpable  bilaterally.  Capillary return is within normal limits  bilaterally. Temperature is within normal limits  bilaterally.  Neurologic  Senn-Weinstein monofilament wire test diminished bilaterally. Muscle power within normal limits bilaterally.  Nails Thick disfigured discolored nails with subungual debris  from hallux to fifth toes bilaterally. No evidence of bacterial infection or drainage bilaterally.  Orthopedic  No limitations of motion  feet .  No crepitus or effusions noted.  No bony pathology or digital deformities noted.  HAV  B/L.  Skin  normotropic skin with no porokeratosis noted bilaterally.  No signs of infections or ulcers noted.     Onychomycosis  Pain in right toes  Pain in left toes  Consent was obtained for treatment procedures.   Mechanical debridement of nails 1-5  bilaterally performed with a nail nipper.  Filed with dremel without incident. Consider diabetic shoes in future.  Return office visit   3 months                  Told patient to return for periodic foot care and evaluation due to potential at risk complications.   Gardiner Barefoot DPM

## 2020-12-11 ENCOUNTER — Telehealth: Payer: Self-pay | Admitting: *Deleted

## 2020-12-11 NOTE — Telephone Encounter (Signed)
Patient is calling to cancel his upcoming appointment for 12/14/20 due to transportation. Please call back to reschedule.

## 2020-12-14 ENCOUNTER — Ambulatory Visit: Payer: Medicare Other | Admitting: Podiatry

## 2021-01-11 ENCOUNTER — Ambulatory Visit: Payer: Medicare Other | Admitting: Podiatry

## 2021-01-14 ENCOUNTER — Ambulatory Visit: Payer: Medicare Other | Admitting: Podiatry

## 2021-01-28 ENCOUNTER — Ambulatory Visit: Payer: Medicare Other | Admitting: Podiatry

## 2021-03-17 ENCOUNTER — Encounter: Payer: Self-pay | Admitting: Internal Medicine

## 2021-07-09 DIAGNOSIS — H269 Unspecified cataract: Secondary | ICD-10-CM | POA: Diagnosis not present

## 2021-07-09 DIAGNOSIS — Z0000 Encounter for general adult medical examination without abnormal findings: Secondary | ICD-10-CM | POA: Diagnosis not present

## 2021-07-09 DIAGNOSIS — I10 Essential (primary) hypertension: Secondary | ICD-10-CM | POA: Diagnosis not present

## 2021-07-09 DIAGNOSIS — M1990 Unspecified osteoarthritis, unspecified site: Secondary | ICD-10-CM | POA: Diagnosis not present

## 2021-07-09 DIAGNOSIS — F17211 Nicotine dependence, cigarettes, in remission: Secondary | ICD-10-CM | POA: Diagnosis not present

## 2021-07-22 DIAGNOSIS — M1990 Unspecified osteoarthritis, unspecified site: Secondary | ICD-10-CM | POA: Diagnosis not present

## 2021-07-22 DIAGNOSIS — Z0000 Encounter for general adult medical examination without abnormal findings: Secondary | ICD-10-CM | POA: Diagnosis not present

## 2021-07-22 DIAGNOSIS — H269 Unspecified cataract: Secondary | ICD-10-CM | POA: Diagnosis not present

## 2021-07-22 DIAGNOSIS — E119 Type 2 diabetes mellitus without complications: Secondary | ICD-10-CM | POA: Diagnosis not present

## 2021-07-22 DIAGNOSIS — E785 Hyperlipidemia, unspecified: Secondary | ICD-10-CM | POA: Diagnosis not present

## 2021-07-22 DIAGNOSIS — Z79899 Other long term (current) drug therapy: Secondary | ICD-10-CM | POA: Diagnosis not present

## 2021-07-22 DIAGNOSIS — I10 Essential (primary) hypertension: Secondary | ICD-10-CM | POA: Diagnosis not present

## 2021-07-25 DIAGNOSIS — E559 Vitamin D deficiency, unspecified: Secondary | ICD-10-CM | POA: Diagnosis not present

## 2021-11-05 ENCOUNTER — Encounter: Payer: Self-pay | Admitting: Internal Medicine

## 2021-12-06 ENCOUNTER — Ambulatory Visit (AMBULATORY_SURGERY_CENTER): Payer: Medicare Other | Admitting: *Deleted

## 2021-12-06 VITALS — Ht 71.0 in | Wt 282.0 lb

## 2021-12-06 DIAGNOSIS — Z86010 Personal history of colonic polyps: Secondary | ICD-10-CM

## 2021-12-06 MED ORDER — PEG 3350-KCL-NA BICARB-NACL 420 G PO SOLR: 4000.0000 mL | Freq: Once | ORAL | 0 refills | Status: AC

## 2021-12-06 NOTE — Progress Notes (Signed)
No egg or soy allergy known to patient  ?No issues known to pt with past sedation with any surgeries or procedures ?Patient denies ever being told they had issues or difficulty with intubation  ?No FH of Malignant Hyperthermia ?Pt is not on diet pills ?Pt is not on  home 02  ?Pt is not on blood thinners  ?Pt denies issues with constipation  ?No A fib or A flutter  ? ? ?PV completed over the phone. Pt verified name, DOB, address and insurance during PV today.  ?Pt mailed instruction packet with copy of consent form to read and not return, and instructions.  ?Pt encouraged to call with questions or issues.  ?If pt has My chart, procedure instructions sent via My Chart  ? ?

## 2021-12-23 ENCOUNTER — Telehealth: Payer: Self-pay | Admitting: Internal Medicine

## 2021-12-23 DIAGNOSIS — Z86010 Personal history of colonic polyps: Secondary | ICD-10-CM

## 2021-12-23 MED ORDER — PEG 3350-KCL-NA BICARB-NACL 420 G PO SOLR: 4000.0000 mL | Freq: Once | ORAL | 0 refills | Status: AC

## 2021-12-23 NOTE — Telephone Encounter (Signed)
Patient's colonoscopy is Friday, 5/5.  He went to the pharmacy to pick up his Golytely and was told they had not received a prescription.  [facility] uses [facility] on the [address] of [address].  Please let patient know when this has been called in.  Thank you. ?

## 2021-12-23 NOTE — Telephone Encounter (Signed)
Spoke with patient Justin [name] Rx sent to [facility] and [place] per pt's request. Pt aware and he will pick this up along with Dulcolax.  ?

## 2021-12-27 ENCOUNTER — Telehealth: Payer: Self-pay | Admitting: Internal Medicine

## 2021-12-27 ENCOUNTER — Ambulatory Visit: Payer: Medicare Other | Admitting: Internal Medicine

## 2021-12-27 MED ORDER — PEG 3350-KCL-NA BICARB-NACL 420 G PO SOLR: 4000.0000 mL | Freq: Once | ORAL | 0 refills | Status: AC

## 2021-12-27 NOTE — Addendum Note (Signed)
Addended by: Steva Ready on: 12/27/2021 09:36 AM ? ? Modules accepted: Orders ? ?

## 2021-12-27 NOTE — Telephone Encounter (Signed)
Good Morning Dr. Hilarie Fredrickson, ? ?Patient called and stated that he needed to reschedule his colonoscopy with you this morning at 9:00 due to forgetting to take his Dulcolax pills yesterday afternoon. Patient stated he called earlier this morning and talked to a nurse and she stated he needed to reschedule.  ? ? ?Patient was rescheduled for 7/5 at 11:30.  ?

## 2021-12-27 NOTE — Telephone Encounter (Signed)
Sent in Golytely to [facility]  called pt- LM to pick up Golytely and dulcolax tablets and that I would my chart and mail new instructions  ? ?New 7-5 -23 colon instructions to My Chart and Mailed  ?

## 2021-12-27 NOTE — Telephone Encounter (Signed)
Good Morning Dr. Hilarie Fredrickson, ? ?I called this patient at 8:15 am today and did not get anyone to answer.  I left a message to call us if he was on his way and to if he was  running late if not to call to reschedule. ? ? ?I will NO SHOW this patient. ?

## 2021-12-27 NOTE — Telephone Encounter (Signed)
Patient called stating that he needed the golytely resent to his pharmacy do to having to reschedule his appointment for his procedure today. Patient had already mixed the golytely and forgot to take dulcolax. Patient was rescheduled for 7/5. Please advise.  ?

## 2021-12-29 NOTE — Progress Notes (Signed)
No show

## 2022-01-07 ENCOUNTER — Telehealth: Payer: Self-pay | Admitting: Internal Medicine

## 2022-01-07 NOTE — Telephone Encounter (Signed)
Left message for pt to call back. ? ?Pt wanted to know if he could take some dulcolax tabs, he is scheduled for a colon in July. Spoke with pt and let him know he can take it and to follow the instructions on the box. ?

## 2022-01-07 NOTE — Telephone Encounter (Signed)
Inbound call from patient stating he went to get his prep medication and they only had the pack of 10 Dulclex tablets. Patient stated he had been having some issues with using the bathroom and was wondering if he could go ahead and take a few of those tablets to see if that helps him. Please advise.  ?

## 2022-01-16 ENCOUNTER — Ambulatory Visit
Admission: EM | Admit: 2022-01-16 | Discharge: 2022-01-16 | Disposition: A | Discharge status: Home / Self Care | Payer: Medicare Other | Attending: Physician Assistant | Admitting: Physician Assistant

## 2022-01-16 ENCOUNTER — Emergency Department (HOSPITAL_COMMUNITY)
Admission: EM | Admit: 2022-01-16 | Discharge: 2022-01-17 | Disposition: A | Discharge status: Home / Self Care | Payer: Medicare Other | Attending: Emergency Medicine | Admitting: Emergency Medicine

## 2022-01-16 DIAGNOSIS — Z794 Long term (current) use of insulin: Secondary | ICD-10-CM | POA: Diagnosis not present

## 2022-01-16 DIAGNOSIS — E1110 Type 2 diabetes mellitus with ketoacidosis without coma: Secondary | ICD-10-CM | POA: Diagnosis not present

## 2022-01-16 DIAGNOSIS — E1165 Type 2 diabetes mellitus with hyperglycemia: Secondary | ICD-10-CM

## 2022-01-16 DIAGNOSIS — E860 Dehydration: Secondary | ICD-10-CM | POA: Diagnosis not present

## 2022-01-16 DIAGNOSIS — Z7984 Long term (current) use of oral hypoglycemic drugs: Secondary | ICD-10-CM | POA: Diagnosis not present

## 2022-01-16 DIAGNOSIS — E1310 Other specified diabetes mellitus with ketoacidosis without coma: Secondary | ICD-10-CM

## 2022-01-16 HISTORY — DX: Type 2 diabetes mellitus without complications: E11.9

## 2022-01-16 LAB — CBG MONITORING, ED
Glucose-Capillary: 485 mg/dL — ABNORMAL HIGH (ref 70–99)
Glucose-Capillary: 459 mg/dL — ABNORMAL HIGH (ref 70–99)

## 2022-01-16 LAB — POCT URINALYSIS DIP (MANUAL ENTRY)
Bilirubin, UA: NEGATIVE
Glucose, UA: >=1000 mg/dL — AB
Leukocytes, UA: NEGATIVE
Nitrite, UA: NEGATIVE
Protein Ur, POC: NEGATIVE mg/dL
Spec Grav, UA: 1.01 (ref 1.010–1.025)
Urobilinogen, UA: 0.2 E.U./dL
pH, UA: 5 (ref 5.0–8.0)

## 2022-01-16 LAB — CBC WITH DIFFERENTIAL/PLATELET
Abs Immature Granulocytes: 0.04 10*3/uL (ref 0.00–0.07)
Basophils Absolute: 0.1 10*3/uL (ref 0.0–0.1)
Basophils Relative: 1 %
Eosinophils Absolute: 0.1 10*3/uL (ref 0.0–0.5)
Eosinophils Relative: 1 %
HCT: 52.8 % — ABNORMAL HIGH (ref 39.0–52.0)
Hemoglobin: 17.9 g/dL — ABNORMAL HIGH (ref 13.0–17.0)
Immature Granulocytes: 1 %
Lymphocytes Relative: 37 %
Lymphs Abs: 2.6 10*3/uL (ref 0.7–4.0)
MCH: 30.2 pg (ref 26.0–34.0)
MCHC: 33.9 g/dL (ref 30.0–36.0)
MCV: 89 fL (ref 80.0–100.0)
Monocytes Absolute: 0.7 10*3/uL (ref 0.1–1.0)
Monocytes Relative: 9 %
Neutro Abs: 3.7 10*3/uL (ref 1.7–7.7)
Neutrophils Relative %: 51 %
Platelets: 221 10*3/uL (ref 150–400)
RBC: 5.93 MIL/uL — ABNORMAL HIGH (ref 4.22–5.81)
RDW: 13.5 % (ref 11.5–15.5)
WBC: 7.2 10*3/uL (ref 4.0–10.5)
nRBC: 0 % (ref 0.0–0.2)

## 2022-01-16 LAB — COMPREHENSIVE METABOLIC PANEL
ALT: 58 U/L — ABNORMAL HIGH (ref 0–44)
AST: 25 U/L (ref 15–41)
Albumin: 4.3 g/dL (ref 3.5–5.0)
Alkaline Phosphatase: 114 U/L (ref 38–126)
Anion gap: 15 (ref 5–15)
BUN: 21 mg/dL (ref 8–23)
CO2: 21 mmol/L — ABNORMAL LOW (ref 22–32)
Calcium: 9.7 mg/dL (ref 8.9–10.3)
Chloride: 103 mmol/L (ref 98–111)
Creatinine, Ser: 1.26 mg/dL — ABNORMAL HIGH (ref 0.61–1.24)
GFR, Estimated: >60 mL/min (ref >60)
Glucose, Bld: 430 mg/dL — ABNORMAL HIGH (ref 70–99)
Potassium: 4.9 mmol/L (ref 3.5–5.1)
Sodium: 139 mmol/L (ref 135–145)
Total Bilirubin: 1.5 mg/dL — ABNORMAL HIGH (ref 0.3–1.2)
Total Protein: 8 g/dL (ref 6.5–8.1)

## 2022-01-16 LAB — BLOOD GAS, VENOUS
Acid-base deficit: 1.1 mmol/L (ref 0.0–2.0)
Bicarbonate: 24.3 mmol/L (ref 20.0–28.0)
O2 Saturation: 63.9 %
Patient temperature: 37
pCO2, Ven: 42 mmHg — ABNORMAL LOW (ref 44–60)
pH, Ven: 7.37 (ref 7.25–7.43)
pO2, Ven: 38 mmHg (ref 32–45)

## 2022-01-16 LAB — LIPASE, BLOOD: Lipase: 24 U/L (ref 11–51)

## 2022-01-16 LAB — BETA-HYDROXYBUTYRIC ACID: Beta-Hydroxybutyric Acid: 4.46 mmol/L — ABNORMAL HIGH (ref 0.05–0.27)

## 2022-01-16 LAB — POCT FASTING CBG KUC MANUAL ENTRY: POCT Glucose (KUC): 468 mg/dL — AB (ref 70–99)

## 2022-01-16 MED ORDER — GLIPIZIDE 10 MG PO TABS: 10.0000 mg | ORAL_TABLET | Freq: Every day | ORAL | 0 refills | Status: DC

## 2022-01-16 MED ORDER — INSULIN ASPART 100 UNIT/ML IJ SOLN
10.0000 [IU] | Freq: Once | INTRAMUSCULAR | Status: AC
  Administered 2022-01-16: 10 [IU] via INTRAVENOUS
  Filled 2022-01-16: qty 0.1

## 2022-01-16 MED ORDER — SODIUM CHLORIDE 0.9 % IV BOLUS
1000.0000 mL | Freq: Once | INTRAVENOUS | Status: AC
  Administered 2022-01-16: 1000 mL via INTRAVENOUS

## 2022-01-16 NOTE — ED Provider Notes (Signed)
[facility]    CSN: [id number] Arrival date & time: 01/16/22  1847      History   Chief Complaint Chief Complaint  Patient presents with   Fatigue    HPI Justin [name] is a 66 y.o. male.   Patient presenting with a several week history of fatigue and malaise.  He reports associated polydipsia, polyphagia, polyuria.  He reports over the past several days he has had nausea and vomiting to the point that is interfering with his ability to eat and drink.  He does have a history of diabetes and was previously prescribed metformin but found this caused side effects so he stopped taking this.  His last A1c available in care everywhere was 03/08/2020 at 7.0%.  He does not check his blood sugar regularly.  He denies any recent dietary or medication changes.  He denies any significant blood loss including melena, hematochezia, hematemesis, bruising.  He has never taken insulin.  He denies previous hospitalization related to diabetes.   Past Medical History:  Diagnosis Date   Arthritis    states he was given med. for inflammation in his "bones" but he isn't taking it right now, remarks " i'm not going to to take something they just throw at me for no reason"   Diabetes mellitus without complication ([place])    Dyspnea    pt. reports this SOB worsens wioth increased heat    Hypercholesteremia    Hypertension    Pre-diabetes    pre diabetes     Patient Active Problem List   Diagnosis Date Noted   Pain due to onychomycosis of toenails of both feet 09/14/2020   Controlled type 2 diabetes mellitus without complication, with long-term current use of insulin ([place]) 03/11/2020   Tetanus, diphtheria, and acellular pertussis (Tdap) vaccination declined 03/08/2020   Pneumococcal vaccination declined 03/08/2020   Primary osteoarthritis of both knees 12/21/2017   Mixed hyperlipidemia 02/23/2017   Essential hypertension 12/27/2016   Tobacco abuse 03/03/2016   Lumbosacral spondylosis  without myelopathy 03/08/2014   Lumbar post-laminectomy syndrome 03/08/2014   Dyslipidemia 06/06/2013   Obesity (BMI 30-39.9) 06/06/2013   Low back pain 11/17/2012   Lumbago 11/17/2012    Past Surgical History:  Procedure Laterality Date   BACK SURGERY  06/24/2004   [facility]-    MULTIPLE EXTRACTIONS WITH ALVEOLOPLASTY N/A 07/03/2017   Procedure: MULTIPLE EXTRACTION WITH ALVEOLOPLASTY;  Surgeon: Diona Browner, DDS;  Location: [facility];  Service: Oral Surgery;  Laterality: N/A;   TONSILLECTOMY     UMBILICAL HERNIA REPAIR         Home Medications    Prior to Admission medications   Medication Sig Start Date End Date Taking? Authorizing Provider  atorvastatin (LIPITOR) 10 MG tablet Take 1 tablet (10 mg total) by mouth daily. Patient not taking: Reported on 12/06/2021 07/13/20   Ladell Pier, MD  meloxicam (MOBIC) 15 MG tablet TAKE 1 TABLET(15 MG) BY MOUTH DAILY Patient not taking: Reported on 12/06/2021 08/16/20   Ladell Pier, MD  metFORMIN (GLUCOPHAGE) 500 MG tablet Take 1 tablet (500 mg total) by mouth daily with breakfast. Patient not taking: Reported on 12/06/2021 07/13/20   Ladell Pier, MD  tamsulosin (FLOMAX) 0.4 MG CAPS capsule Take 1 capsule (0.4 mg total) by mouth daily. Patient not taking: Reported on 07/13/2020 12/21/17   Ladell Pier, MD    Family History Family History  Problem Relation Age of Onset   Heart disease Mother    Colon  cancer Father    Heart disease Father    Esophageal cancer Neg Hx    Stomach cancer Neg Hx    Rectal cancer Neg Hx     Social History Social History   Tobacco Use   Smoking status: Former    Packs/day: 0.25    Types: Cigarettes   Smokeless tobacco: Never  Vaping Use   Vaping Use: Never used  Substance Use Topics   Alcohol use: Yes    Alcohol/week: 2.0 - 3.0 standard drinks    Types: 2 - 3 Cans of beer per week    Comment: occasional   Drug use: Not Currently     Allergies   Patient has no known  allergies.   Review of Systems Review of Systems  Constitutional:  Positive for activity change and fatigue. Negative for appetite change and fever.  Respiratory:  Negative for cough and shortness of breath.   Cardiovascular:  Negative for chest pain.  Gastrointestinal:  Positive for nausea and vomiting. Negative for abdominal pain and diarrhea.  Endocrine: Positive for polydipsia, polyphagia and polyuria.  Genitourinary:  Positive for urgency. Negative for dysuria and frequency.    Physical Exam Triage Vital Signs ED Triage Vitals  Enc Vitals Group     BP 01/16/22 1918 129/71     Pulse Rate 01/16/22 1918 90     Resp 01/16/22 1918 18     Temp 01/16/22 1918 98.1 F (36.7 C)     Temp Source 01/16/22 1918 Oral     SpO2 01/16/22 1918 96 %     Weight --      Height --      Head Circumference --      Peak Flow --      Pain Score 01/16/22 1917 0     Pain Loc --      Pain Edu? --      Excl. in [place]? --    No data found.  Updated Vital Signs BP 129/71   Pulse 90   Temp 98.1 F (36.7 C) (Oral)   Resp 18   SpO2 96%   Visual Acuity Right Eye Distance:   Left Eye Distance:   Bilateral Distance:    Right Eye Near:   Left Eye Near:    Bilateral Near:     Physical Exam Vitals reviewed.  Constitutional:      General: He is awake.     Appearance: Normal appearance. He is well-developed. He is not ill-appearing.     Comments: Very pleasant male appears agitated no acute distress sitting comfortably in exam room  HENT:     Head: Normocephalic and atraumatic.     Mouth/Throat:     Pharynx: Uvula midline. No oropharyngeal exudate or posterior oropharyngeal erythema.  Cardiovascular:     Rate and Rhythm: Normal rate and regular rhythm.     Heart sounds: Normal heart sounds, S1 normal and S2 normal. No murmur heard. Pulmonary:     Effort: Pulmonary effort is normal.     Breath sounds: Normal breath sounds. No stridor. No wheezing, rhonchi or rales.     Comments: Clear to  auscultation bilaterally Abdominal:     General: Bowel sounds are normal.     Palpations: Abdomen is soft.     Tenderness: There is no abdominal tenderness.     Comments: Benign abdominal exam  Neurological:     Mental Status: He is alert.  Psychiatric:        Behavior: Behavior is  cooperative.     UC Treatments / Results  Labs (all labs ordered are listed, but only abnormal results are displayed) Labs Reviewed  POCT FASTING CBG [facility] MANUAL ENTRY - Abnormal; Notable for the following components:      Result Value   POCT Glucose ([facility]) 468 (*)    All other components within normal limits  POCT URINALYSIS DIP (MANUAL ENTRY) - Abnormal; Notable for the following components:   Glucose, UA >=1,000 (*)    Ketones, POC UA large (80) (*)    Blood, UA small (*)    All other components within normal limits    EKG   Radiology No results found.  Procedures Procedures (including critical care time)  Medications Ordered in UC Medications - No data to display  Initial Impression / Assessment and Plan / UC Course  I have reviewed the triage vital signs and the nursing notes.  Pertinent labs & imaging results that were available during my care of the patient were reviewed by me and considered in my medical decision making (see chart for details).     Patient's blood sugar was elevated at 468 and UA showed elevated ketones and glucosuria.  Discussed that I believe his symptoms are related to uncontrolled blood sugar and he needs to go to the emergency room for fluids and likely insulin initiation.  Patient was agreeable and will go directly to ER for further evaluation and management.  Vital signs were stable the time of discharge and he was safe for private transport.  He will have his sister take him to the ER.  Final Clinical Impressions(s) / UC Diagnoses   Final diagnoses:  Uncontrolled type 2 diabetes mellitus with hyperglycemia ([place])  Ketosis due to diabetes [facility])      Discharge Instructions      Go to the emergency room immediately.     ED Prescriptions   None    PDMP not reviewed this encounter.   Terrilee Croak, PA-C 01/16/22 1952

## 2022-01-16 NOTE — ED Provider Notes (Signed)
[facility] DEPT Provider Note   CSN: [id number] Arrival date & time: 01/16/22  2039     History  Chief Complaint  Patient presents with   Hyperglycemia    UZOMA VIVONA is a 66 y.o. male.  Pt is a 66 yo male with a pmhx significant for dm2, hypercholesterolemia, arthritis, BPH and htn.  He has been having increased thirst and nausea.  He stopped taking his metformin about 2 weeks ago.  He said he was taking it at night and thought it was causing nightmares.  He went to UC today and BS was over 400, so he was sent to the ED.  Pt has occasional abd pain, but no pain now.  No f/c.      Home Medications Prior to Admission medications   Medication Sig Start Date End Date Taking? Authorizing Provider  acetaminophen (TYLENOL) 500 MG tablet Take 500 mg by mouth every 6 (six) hours as needed for mild pain.   Yes [provider]  glipiZIDE (GLUCOTROL) 10 MG tablet Take 1 tablet (10 mg total) by mouth daily before breakfast. 01/16/22  Yes Isla Pence, MD  atorvastatin (LIPITOR) 10 MG tablet Take 1 tablet (10 mg total) by mouth daily. Patient not taking: Reported on 12/06/2021 07/13/20   Ladell Pier, MD  meloxicam (MOBIC) 15 MG tablet TAKE 1 TABLET(15 MG) BY MOUTH DAILY Patient not taking: Reported on 12/06/2021 08/16/20   Ladell Pier, MD  metFORMIN (GLUCOPHAGE) 500 MG tablet Take 1 tablet (500 mg total) by mouth daily with breakfast. Patient not taking: Reported on 12/06/2021 07/13/20   Ladell Pier, MD  tamsulosin (FLOMAX) 0.4 MG CAPS capsule Take 1 capsule (0.4 mg total) by mouth daily. Patient not taking: Reported on 07/13/2020 12/21/17   Ladell Pier, MD      Allergies    Patient has no known allergies.    Review of Systems   Review of Systems  Gastrointestinal:  Positive for nausea.  Endocrine: Positive for polydipsia and polyuria.  All other systems reviewed and are negative.  Physical Exam Updated Vital  Signs BP (!) 136/98   Pulse 77   Temp 98.3 F (36.8 C) (Oral)   Resp 18   SpO2 99%  Physical Exam Vitals and nursing note reviewed.  Constitutional:      Appearance: Normal appearance.  HENT:     Head: Normocephalic and atraumatic.     Right Ear: External ear normal.     Left Ear: External ear normal.     Nose: Nose normal.     Mouth/Throat:     Mouth: Mucous membranes are dry.  Eyes:     Extraocular Movements: Extraocular movements intact.     Conjunctiva/sclera: Conjunctivae normal.     Pupils: Pupils are equal, round, and reactive to light.  Cardiovascular:     Rate and Rhythm: Normal rate and regular rhythm.     Pulses: Normal pulses.     Heart sounds: Normal heart sounds.  Pulmonary:     Effort: Pulmonary effort is normal.     Breath sounds: Normal breath sounds.  Abdominal:     General: Abdomen is flat. Bowel sounds are normal.     Palpations: Abdomen is soft.  Musculoskeletal:        General: Normal range of motion.     Cervical back: Normal range of motion and neck supple.  Skin:    General: Skin is warm.     Capillary Refill: Capillary  refill takes less than 2 seconds.  Neurological:     General: No focal deficit present.     Mental Status: He is alert and oriented to person, place, and time.  Psychiatric:        Mood and Affect: Mood normal.        Behavior: Behavior normal.        Thought Content: Thought content normal.        Judgment: Judgment normal.    ED Results / Procedures / Treatments   Labs (all labs ordered are listed, but only abnormal results are displayed) Labs Reviewed  COMPREHENSIVE METABOLIC PANEL - Abnormal; Notable for the following components:      Result Value   CO2 21 (*)    Glucose, Bld 430 (*)    Creatinine, Ser 1.26 (*)    ALT 58 (*)    Total Bilirubin 1.5 (*)    All other components within normal limits  CBC WITH DIFFERENTIAL/PLATELET - Abnormal; Notable for the following components:   RBC 5.93 (*)    Hemoglobin 17.9  (*)    HCT 52.8 (*)    All other components within normal limits  BETA-HYDROXYBUTYRIC ACID - Abnormal; Notable for the following components:   Beta-Hydroxybutyric Acid 4.46 (*)    All other components within normal limits  BLOOD GAS, VENOUS - Abnormal; Notable for the following components:   pCO2, Ven 42 (*)    All other components within normal limits  CBG MONITORING, ED - Abnormal; Notable for the following components:   Glucose-Capillary 485 (*)    All other components within normal limits  CBG MONITORING, ED - Abnormal; Notable for the following components:   Glucose-Capillary 459 (*)    All other components within normal limits  LIPASE, BLOOD  URINALYSIS, ROUTINE W REFLEX MICROSCOPIC  BETA-HYDROXYBUTYRIC ACID  CBG MONITORING, ED    EKG None  Radiology No results found.  Procedures Procedures    Medications Ordered in ED Medications  sodium chloride 0.9 % bolus 1,000 mL (1,000 mLs Intravenous New Bag/Given 01/16/22 2239)  insulin aspart (novoLOG) injection 10 Units (10 Units Intravenous Given 01/16/22 2238)  insulin aspart (novoLOG) injection 10 Units (10 Units Intravenous Given 01/16/22 2325)    ED Course/ Medical Decision Making/ A&P                           Medical Decision Making Amount and/or Complexity of Data Reviewed Labs: ordered.  Risk Prescription drug management.   This patient presents to the ED for concern of hyperglycemia, this involves an extensive number of treatment options, and is a complaint that carries with it a high risk of complications and morbidity.  The differential diagnosis includes dka, hyperosmolar hyperglycemic state, poorly controlled dm   Co morbidities that complicate the patient evaluation  dm2, hypercholesterolemia, arthritis, BPH and htn   Additional history obtained:  Additional history obtained from epic chart review    Lab Tests:  I Ordered, and personally interpreted labs.  The pertinent results include:  cbc  with nl wbc; h/h slightly elevated 17.9/52.8.  This is likely hemoconcentration from dehydration; bs elevated at 430; beta hydroxybutyric acid elevated at 4.46, but pt is not acidotic.  Cr 1.26.      Cardiac Monitoring:  The patient was maintained on a cardiac monitor.  I personally viewed and interpreted the cardiac monitored which showed an underlying rhythm of: nsr   Medicines ordered and prescription drug management:  I ordered medication including insulin and ivfs  for hyperglycemia and dehydration  Reevaluation of the patient after these medicines showed that the patient improved I have reviewed the patients home medicines and have made adjustments as needed    Critical Interventions:  IVFs and IV insulin    Problem List / ED Course:  Hyperglycemia:  BHB elevated, but pt is not acidotic.  BS is still elevated.  I will give additional IVFs and IV insulin and recheck.  I suspect they may clear.  However, if they don't, he will need admission.   Reevaluation:  After the interventions noted above, I reevaluated the patient and found that they have :improved   Social Determinants of Health:  Lives at home   Dispostion:  Pending at shift change  Pt signed out to Dr. Florina Ou at shift change.        Final Clinical Impression(s) / ED Diagnoses Final diagnoses:  Poorly controlled type 2 diabetes mellitus ([place])  Dehydration    Rx / DC Orders ED Discharge Orders          Ordered    glipiZIDE (GLUCOTROL) 10 MG tablet  Daily before breakfast        01/16/22 2315              Isla Pence, MD 01/16/22 2337

## 2022-01-16 NOTE — Discharge Instructions (Signed)
Go to the emergency room immediately.

## 2022-01-16 NOTE — ED Triage Notes (Addendum)
Patient presents to Urgent Care with complaints of nausea, vomiting, fatigue, polydipsia, polyuria  since mother day. Patient reports metformin was making him sick so he has stopped taking that

## 2022-01-16 NOTE — Discharge Instructions (Addendum)
Follow up your your PCP to get your blood sugar rechecked.

## 2022-01-16 NOTE — ED Triage Notes (Signed)
Pt presents with vomiting and fatigue. Sent from UC bc they think he has diabetes. CBG in 400s.

## 2022-01-17 ENCOUNTER — Encounter (HOSPITAL_COMMUNITY): Payer: Self-pay | Admitting: Emergency Medicine

## 2022-01-17 DIAGNOSIS — E1165 Type 2 diabetes mellitus with hyperglycemia: Secondary | ICD-10-CM | POA: Diagnosis not present

## 2022-01-17 LAB — URINALYSIS, ROUTINE W REFLEX MICROSCOPIC
Bacteria, UA: NONE SEEN
Bilirubin Urine: NEGATIVE
Glucose, UA: >=500 mg/dL — AB
Ketones, ur: 80 mg/dL — AB
Leukocytes,Ua: NEGATIVE
Nitrite: NEGATIVE
Protein, ur: NEGATIVE mg/dL
Specific Gravity, Urine: 1.016 (ref 1.005–1.030)
pH: 5 (ref 5.0–8.0)

## 2022-01-17 LAB — CBG MONITORING, ED: Glucose-Capillary: 271 mg/dL — ABNORMAL HIGH (ref 70–99)

## 2022-01-17 LAB — BETA-HYDROXYBUTYRIC ACID: Beta-Hydroxybutyric Acid: 3.07 mmol/L — ABNORMAL HIGH (ref 0.05–0.27)

## 2022-01-17 MED ORDER — GLIPIZIDE 10 MG PO TABS: 10.0000 mg | ORAL_TABLET | Freq: Every day | ORAL | 0 refills | Status: DC

## 2022-01-17 NOTE — ED Provider Notes (Signed)
Nursing notes and vitals signs, including pulse oximetry, reviewed.  Summary of this visit's results, reviewed by myself:  EKG:  EKG Interpretation  Date/Time:    Ventricular Rate:    PR Interval:    QRS Duration:   QT Interval:    QTC Calculation:   R Axis:     Text Interpretation:          Labs:  Results for orders placed or performed during the hospital encounter of 01/16/22 (from the past 24 hour(s))  CBG monitoring, ED     Status: Abnormal   Collection Time: 01/16/22  9:05 PM  Result Value Ref Range   Glucose-Capillary 485 (H) 70 - 99 mg/dL  Comprehensive metabolic panel     Status: Abnormal   Collection Time: 01/16/22 10:23 PM  Result Value Ref Range   Sodium 139 135 - 145 mmol/L   Potassium 4.9 3.5 - 5.1 mmol/L   Chloride 103 98 - 111 mmol/L   CO2 21 (L) 22 - 32 mmol/L   Glucose, Bld 430 (H) 70 - 99 mg/dL   BUN 21 8 - 23 mg/dL   Creatinine, Ser 1.26 (H) 0.61 - 1.24 mg/dL   Calcium 9.7 8.9 - 10.3 mg/dL   Total Protein 8.0 6.5 - 8.1 g/dL   Albumin 4.3 3.5 - 5.0 g/dL   AST 25 15 - 41 U/L   ALT 58 (H) 0 - 44 U/L   Alkaline Phosphatase 114 38 - 126 U/L   Total Bilirubin 1.5 (H) 0.3 - 1.2 mg/dL   GFR, Estimated >60 >60 mL/min   Anion gap 15 5 - 15  CBC with Differential     Status: Abnormal   Collection Time: 01/16/22 10:23 PM  Result Value Ref Range   WBC 7.2 4.0 - 10.5 K/uL   RBC 5.93 (H) 4.22 - 5.81 MIL/uL   Hemoglobin 17.9 (H) 13.0 - 17.0 g/dL   HCT 52.8 (H) 39.0 - 52.0 %   MCV 89.0 80.0 - 100.0 fL   MCH 30.2 26.0 - 34.0 pg   MCHC 33.9 30.0 - 36.0 g/dL   RDW 13.5 11.5 - 15.5 %   Platelets 221 150 - 400 K/uL   nRBC 0.0 0.0 - 0.2 %   Neutrophils Relative % 51 %   Neutro Abs 3.7 1.7 - 7.7 K/uL   Lymphocytes Relative 37 %   Lymphs Abs 2.6 0.7 - 4.0 K/uL   Monocytes Relative 9 %   Monocytes Absolute 0.7 0.1 - 1.0 K/uL   Eosinophils Relative 1 %   Eosinophils Absolute 0.1 0.0 - 0.5 K/uL   Basophils Relative 1 %   Basophils Absolute 0.1 0.0 - 0.1 K/uL    Immature Granulocytes 1 %   Abs Immature Granulocytes 0.04 0.00 - 0.07 K/uL  Lipase, blood     Status: None   Collection Time: 01/16/22 10:23 PM  Result Value Ref Range   Lipase 24 11 - 51 U/L  Beta-hydroxybutyric acid     Status: Abnormal   Collection Time: 01/16/22 10:23 PM  Result Value Ref Range   Beta-Hydroxybutyric Acid 4.46 (H) 0.05 - 0.27 mmol/L  Blood gas, venous (at [facility] and [facility], not at [facility])     Status: Abnormal   Collection Time: 01/16/22 10:23 PM  Result Value Ref Range   pH, Ven 7.37 7.25 - 7.43   pCO2, Ven 42 (L) 44 - 60 mmHg   pO2, Ven 38 32 - 45 mmHg   Bicarbonate 24.3 20.0 - 28.0 mmol/L  Acid-base deficit 1.1 0.0 - 2.0 mmol/L   O2 Saturation 63.9 %   Patient temperature 37.0   POC CBG, ED     Status: Abnormal   Collection Time: 01/16/22 11:11 PM  Result Value Ref Range   Glucose-Capillary 459 (H) 70 - 99 mg/dL  Beta-hydroxybutyric acid     Status: Abnormal   Collection Time: 01/16/22 11:52 PM  Result Value Ref Range   Beta-Hydroxybutyric Acid 3.07 (H) 0.05 - 0.27 mmol/L  Urinalysis, Routine w reflex microscopic Urine, Clean Catch     Status: Abnormal   Collection Time: 01/17/22  1:10 AM  Result Value Ref Range   Color, Urine YELLOW YELLOW   APPearance CLEAR CLEAR   Specific Gravity, Urine 1.016 1.005 - 1.030   pH 5.0 5.0 - 8.0   Glucose, UA >=500 (A) NEGATIVE mg/dL   Hgb urine dipstick MODERATE (A) NEGATIVE   Bilirubin Urine NEGATIVE NEGATIVE   Ketones, ur 80 (A) NEGATIVE mg/dL   Protein, ur NEGATIVE NEGATIVE mg/dL   Nitrite NEGATIVE NEGATIVE   Leukocytes,Ua NEGATIVE NEGATIVE   RBC / HPF 0-5 0 - 5 RBC/hpf   WBC, UA 0-5 0 - 5 WBC/hpf   Bacteria, UA NONE SEEN NONE SEEN   Squamous Epithelial / LPF 0-5 0 - 5   Mucus PRESENT   POC CBG, ED     Status: Abnormal   Collection Time: 01/17/22  1:13 AM  Result Value Ref Range   Glucose-Capillary 271 (H) 70 - 99 mg/dL   1:39 AM Sugar now below 300.  Beta hydroxybutyric acid level has improved.   Justin [name],  Justin Reichmann, MD 01/17/22 [phone]

## 2022-01-17 NOTE — ED Notes (Signed)
Discharge instructions reviewed with patient. Pt. Showed verbal understanding of paperwork and left the ED with his sister.

## 2022-01-18 ENCOUNTER — Encounter (HOSPITAL_COMMUNITY): Payer: Self-pay

## 2022-01-18 ENCOUNTER — Other Ambulatory Visit: Payer: Self-pay

## 2022-01-18 ENCOUNTER — Emergency Department (HOSPITAL_COMMUNITY)
Admission: EM | Admit: 2022-01-18 | Discharge: 2022-01-19 | Disposition: A | Discharge status: Home / Self Care | Payer: Medicare Other | Attending: Emergency Medicine | Admitting: Emergency Medicine

## 2022-01-18 DIAGNOSIS — R7989 Other specified abnormal findings of blood chemistry: Secondary | ICD-10-CM | POA: Diagnosis not present

## 2022-01-18 DIAGNOSIS — R739 Hyperglycemia, unspecified: Secondary | ICD-10-CM | POA: Diagnosis present

## 2022-01-18 DIAGNOSIS — E1165 Type 2 diabetes mellitus with hyperglycemia: Secondary | ICD-10-CM | POA: Insufficient documentation

## 2022-01-18 DIAGNOSIS — Z7984 Long term (current) use of oral hypoglycemic drugs: Secondary | ICD-10-CM | POA: Insufficient documentation

## 2022-01-18 LAB — BASIC METABOLIC PANEL
Anion gap: 16 — ABNORMAL HIGH (ref 5–15)
BUN: 23 mg/dL (ref 8–23)
CO2: 15 mmol/L — ABNORMAL LOW (ref 22–32)
Calcium: 9.7 mg/dL (ref 8.9–10.3)
Chloride: 105 mmol/L (ref 98–111)
Creatinine, Ser: 1.62 mg/dL — ABNORMAL HIGH (ref 0.61–1.24)
GFR, Estimated: 46 mL/min — ABNORMAL LOW (ref >60)
Glucose, Bld: 363 mg/dL — ABNORMAL HIGH (ref 70–99)
Potassium: 4.5 mmol/L (ref 3.5–5.1)
Sodium: 136 mmol/L (ref 135–145)

## 2022-01-18 LAB — I-STAT VENOUS BLOOD GAS, ED
Acid-base deficit: 6 mmol/L — ABNORMAL HIGH (ref 0.0–2.0)
Bicarbonate: 19.2 mmol/L — ABNORMAL LOW (ref 20.0–28.0)
Calcium, Ion: 1.19 mmol/L (ref 1.15–1.40)
HCT: 48 % (ref 39.0–52.0)
Hemoglobin: 16.3 g/dL (ref 13.0–17.0)
O2 Saturation: 59 %
Potassium: 4.4 mmol/L (ref 3.5–5.1)
Sodium: 138 mmol/L (ref 135–145)
TCO2: 20 mmol/L — ABNORMAL LOW (ref 22–32)
pCO2, Ven: 36 mmHg — ABNORMAL LOW (ref 44–60)
pH, Ven: 7.335 (ref 7.25–7.43)
pO2, Ven: 32 mmHg (ref 32–45)

## 2022-01-18 LAB — CBC WITH DIFFERENTIAL/PLATELET
Abs Immature Granulocytes: 0.09 10*3/uL — ABNORMAL HIGH (ref 0.00–0.07)
Basophils Absolute: 0.1 10*3/uL (ref 0.0–0.1)
Basophils Relative: 1 %
Eosinophils Absolute: 0 10*3/uL (ref 0.0–0.5)
Eosinophils Relative: 0 %
HCT: 51.6 % (ref 39.0–52.0)
Hemoglobin: 17 g/dL (ref 13.0–17.0)
Immature Granulocytes: 1 %
Lymphocytes Relative: 27 %
Lymphs Abs: 2.7 10*3/uL (ref 0.7–4.0)
MCH: 29.8 pg (ref 26.0–34.0)
MCHC: 32.9 g/dL (ref 30.0–36.0)
MCV: 90.4 fL (ref 80.0–100.0)
Monocytes Absolute: 0.9 10*3/uL (ref 0.1–1.0)
Monocytes Relative: 9 %
Neutro Abs: 6.3 10*3/uL (ref 1.7–7.7)
Neutrophils Relative %: 62 %
Platelets: 198 10*3/uL (ref 150–400)
RBC: 5.71 MIL/uL (ref 4.22–5.81)
RDW: 13.5 % (ref 11.5–15.5)
WBC: 10.1 10*3/uL (ref 4.0–10.5)
nRBC: 0 % (ref 0.0–0.2)

## 2022-01-18 LAB — URINALYSIS, ROUTINE W REFLEX MICROSCOPIC
Bacteria, UA: NONE SEEN
Bilirubin Urine: NEGATIVE
Glucose, UA: >=500 mg/dL — AB
Ketones, ur: 80 mg/dL — AB
Leukocytes,Ua: NEGATIVE
Nitrite: NEGATIVE
Protein, ur: NEGATIVE mg/dL
Specific Gravity, Urine: 1.015 (ref 1.005–1.030)
pH: 5 (ref 5.0–8.0)

## 2022-01-18 LAB — HEMOGLOBIN A1C
Hgb A1c MFr Bld: 12.8 % — ABNORMAL HIGH (ref 4.8–5.6)
Mean Plasma Glucose: 320.66 mg/dL

## 2022-01-18 LAB — BETA-HYDROXYBUTYRIC ACID: Beta-Hydroxybutyric Acid: 4.14 mmol/L — ABNORMAL HIGH (ref 0.05–0.27)

## 2022-01-18 LAB — CBG MONITORING, ED: Glucose-Capillary: 366 mg/dL — ABNORMAL HIGH (ref 70–99)

## 2022-01-18 MED ORDER — SODIUM CHLORIDE 0.9 % IV BOLUS
1000.0000 mL | Freq: Once | INTRAVENOUS | Status: AC
Start: 2022-01-18 — End: 2022-01-18
  Administered 2022-01-18: 1000 mL via INTRAVENOUS

## 2022-01-18 MED ORDER — SODIUM CHLORIDE 0.9 % IV BOLUS
1000.0000 mL | Freq: Once | INTRAVENOUS | Status: AC
  Administered 2022-01-18: 1000 mL via INTRAVENOUS

## 2022-01-18 MED ORDER — INSULIN ASPART 100 UNIT/ML IJ SOLN
8.0000 [IU] | Freq: Once | INTRAMUSCULAR | Status: AC
  Administered 2022-01-18: 8 [IU] via SUBCUTANEOUS

## 2022-01-18 NOTE — ED Triage Notes (Signed)
Pt came in POV from home with c/o hyperglycemia. Reports that his CBG was in the 300's around 0935 this morning & in the 400's around 1100. States he was on insulin gtt last visit & has an app. to see PCP about his blood sugars next Friday (01/24/22). A/Ox4, denies pain or other s/s.

## 2022-01-18 NOTE — ED Provider Notes (Signed)
[facility] EMERGENCY DEPARTMENT Provider Note   CSN: [id number] Arrival date & time: 01/18/22  1544     History  Chief Complaint  Patient presents with   Hyperglycemia    Justin [name] is a 66 y.o. male.  Patient presents to the emergency department for evaluation of high blood sugar.  Patient was seen in the emergency department 2 days ago for the same.  His blood sugars were in the 500 range.  He was given IV fluids and some insulin and discharged home.  Patient obtained a glucometer yesterday.  He was started on glipizide.  Was previously on metformin but states that this was causing nightmares so he discontinued.  First dose of glipizide was earlier today.  He checked his blood sugar on his glucometer today and sugar was in the 300s this morning and 400s in the early afternoon.  This concerned him so he decided to come in to the emergency department.  He continues to have polydipsia and polyuria.  No persistent vomiting or abdominal pain.  No fevers or other infectious symptoms.  No burning with urination.  He is scheduled to see primary care doctor on 01/24/2022.      Home Medications Prior to Admission medications   Medication Sig Start Date End Date Taking? Authorizing Provider  acetaminophen (TYLENOL) 500 MG tablet Take 500 mg by mouth every 6 (six) hours as needed for mild pain.    [provider]  atorvastatin (LIPITOR) 10 MG tablet Take 1 tablet (10 mg total) by mouth daily. Patient not taking: Reported on 12/06/2021 07/13/20   Ladell Pier, MD  glipiZIDE (GLUCOTROL) 10 MG tablet Take 1 tablet (10 mg total) by mouth daily before breakfast. 01/17/22   Molpus, John, MD  meloxicam (MOBIC) 15 MG tablet TAKE 1 TABLET(15 MG) BY MOUTH DAILY Patient not taking: Reported on 12/06/2021 08/16/20   Ladell Pier, MD  metFORMIN (GLUCOPHAGE) 500 MG tablet Take 1 tablet (500 mg total) by mouth daily with breakfast. Patient not taking: Reported on  12/06/2021 07/13/20   Ladell Pier, MD  tamsulosin (FLOMAX) 0.4 MG CAPS capsule Take 1 capsule (0.4 mg total) by mouth daily. Patient not taking: Reported on 07/13/2020 12/21/17   Ladell Pier, MD      Allergies    Patient has no known allergies.    Review of Systems   Review of Systems  Physical Exam Updated Vital Signs BP (!) 164/89   Pulse 85   Temp (!) 97.5 F (36.4 C) (Oral)   Resp 15   SpO2 97%   Physical Exam Vitals and nursing note reviewed.  Constitutional:      General: He is not in acute distress.    Appearance: He is well-developed.  HENT:     Head: Normocephalic and atraumatic.     Right Ear: External ear normal.     Left Ear: External ear normal.     Mouth/Throat:     Mouth: Mucous membranes are moist.  Eyes:     General:        Right eye: No discharge.        Left eye: No discharge.     Conjunctiva/sclera: Conjunctivae normal.  Cardiovascular:     Rate and Rhythm: Normal rate and regular rhythm.     Heart sounds: Normal heart sounds.  Pulmonary:     Effort: Pulmonary effort is normal.     Breath sounds: Normal breath sounds.  Abdominal:  Palpations: Abdomen is soft.     Tenderness: There is no abdominal tenderness. There is no guarding or rebound.  Musculoskeletal:     Cervical back: Normal range of motion and neck supple.  Skin:    General: Skin is warm and dry.  Neurological:     Mental Status: He is alert.    ED Results / Procedures / Treatments   Labs (all labs ordered are listed, but only abnormal results are displayed) Labs Reviewed  CBC WITH DIFFERENTIAL/PLATELET - Abnormal; Notable for the following components:      Result Value   Abs Immature Granulocytes 0.09 (*)    All other components within normal limits  BASIC METABOLIC PANEL - Abnormal; Notable for the following components:   CO2 15 (*)    Glucose, Bld 363 (*)    Creatinine, Ser 1.62 (*)    GFR, Estimated 46 (*)    Anion gap 16 (*)    All other components  within normal limits  HEMOGLOBIN A1C - Abnormal; Notable for the following components:   Hgb A1c MFr Bld 12.8 (*)    All other components within normal limits  BETA-HYDROXYBUTYRIC ACID - Abnormal; Notable for the following components:   Beta-Hydroxybutyric Acid 4.14 (*)    All other components within normal limits  URINALYSIS, ROUTINE W REFLEX MICROSCOPIC - Abnormal; Notable for the following components:   Glucose, UA >=500 (*)    Hgb urine dipstick MODERATE (*)    Ketones, ur 80 (*)    All other components within normal limits  CBG MONITORING, ED - Abnormal; Notable for the following components:   Glucose-Capillary 366 (*)    All other components within normal limits  I-STAT VENOUS BLOOD GAS, ED - Abnormal; Notable for the following components:   pCO2, Ven 36.0 (*)    Bicarbonate 19.2 (*)    TCO2 20 (*)    Acid-base deficit 6.0 (*)    All other components within normal limits  BASIC METABOLIC PANEL    EKG None  Radiology No results found.  Procedures Procedures    Medications Ordered in ED Medications  sodium chloride 0.9 % bolus 1,000 mL (0 mLs Intravenous Stopped 01/18/22 1907)  insulin aspart (novoLOG) injection 8 Units (8 Units Subcutaneous Given 01/18/22 1809)  sodium chloride 0.9 % bolus 1,000 mL (1,000 mLs Intravenous New Bag/Given 01/18/22 2021)    ED Course/ Medical Decision Making/ A&P    Patient seen and examined. History obtained directly from patient.  Reviewed previous ED notes and labs. Patient's beta hydroxybutyric acid was slightly elevated at last visit.    Labs/EKG: Ordered CBC and BMP.  Also ordered hemoglobin A1c as this will likely be helpful to PCP.  Imaging: None ordered.  Medications/Fluids: None ordered.  Considered administration of: IV fluids and insulin but will wait to see what his initial labs look like.  Most recent vital signs reviewed and are as follows: BP (!) 164/89   Pulse 85   Temp (!) 97.5 F (36.4 C) (Oral)   Resp 15    SpO2 97%   Initial impression: Hyperglycemia, low concern for ketosis clinically.  Patient currently only on glipizide and will likely need additional medications eventually.  Discussed with patient that it is going to take a few days to get established on the glipizide and that what his blood sugars will settle to.  Also I would like him to run a little bit higher rather than normal in the short-term to help prevent hypoglycemic episodes from  the glipizide.  6:31 PM Reassessment performed. Patient appears comfortable, stable.  Labs personally reviewed and interpreted including: CBC unremarkable with normal white blood cell count; BMP with glucose 363, bicarb 15, creatinine worse from 2 days ago at 1.62 and anion gap of 16; hemoglobin A1c 12.8%.   Reviewed pertinent lab work and imaging with patient at bedside. Questions answered.   Most current vital signs reviewed and are as follows: BP (!) 143/94   Pulse 79   Temp (!) 97.5 F (36.4 C) (Oral)   Resp 17   SpO2 100%   Plan: Given worsening renal function and worsening bicarbonate from recent ED visit, will give IV fluid bolus, subcutaneous insulin, and check beta hydroxybutyrate and VBG.  8:42 PM Reassessment performed. Patient appears stable.   Labs personally reviewed and interpreted including: Venous blood gas with normal pH, bicarb 19; beta-hydroxybutyrate at 4.14 which is similar to what it was previous ED visit.  Reviewed pertinent lab work and imaging with patient at bedside. Questions answered.   Most current vital signs reviewed and are as follows: BP (!) 153/96   Pulse 87   Temp (!) 97.5 F (36.4 C) (Oral)   Resp 18   SpO2 95%   Plan: Fluid bolus, recheck BMP.  11:21 PM Reassessment performed. Patient appears stable.  Family member at bedside.  Fluid bolus currently running.  Checked with RN, unfortunately bolus was not completed after order was placed.  This should be done in the next 20 to 30 minutes.  At that time,  patient will have BMP rechecked.  Reviewed pertinent lab work and imaging with patient at bedside. Questions answered.   Most current vital signs reviewed and are as follows: BP (!) 153/96   Pulse 87   Temp (!) 97.5 F (36.4 C) (Oral)   Resp 18   SpO2 95%   Plan: Recheck BMP, to ensure trend towards improvement in bicarb and creatinine.  Discussed with [place] PA-C at shift change.  If BMP is improved, feel that patient can be discharged to home.  He does require close PCP follow-up and has an appointment next week.  He will need to continue the glipizide.                          Medical Decision Making Amount and/or Complexity of Data Reviewed Labs: ordered.  Risk Prescription drug management.   Patient with poorly controlled diabetes, currently only on sulfonylurea.  Unable to tolerate metformin.  This is his second ED visit in the past several days for the same.  Clinically he does not have vomiting or abdominal pain to suggest significant DKA.  His beta hydroxybutyrate has been intermediate.  Mild bump in creatinine today with mild anion gap elevation and low bicarb.  Patient clinically appears well, somewhat dry on exam.  He is being treated with IV fluids and had some subcutaneous insulin earlier.  If he is trending towards improvement, do not feel that he would require admission to the hospital today given his well appearance.        Final Clinical Impression(s) / ED Diagnoses Final diagnoses:  Hyperglycemia  Elevated serum creatinine    Rx / DC Orders ED Discharge Orders     None         Carlisle Cater, PA-C 01/18/22 2324    Ezequiel Essex, MD 01/19/22 [phone]

## 2022-01-18 NOTE — ED Provider Notes (Signed)
Care assumed from previous provider PA Jacob [facility] . Please see note for further details.  In short, patient is a 66 year old male with a past medical history of diabetes presenting today with elevated blood sugar.  He presented similarly 2 days ago.  Not in DKA/HHS.  Plan is to continue to hydrate the patient and recheck BMP.  Can discharge if this is better.   Physical Exam  BP (!) 153/96   Pulse 87   Temp (!) 97.5 F (36.4 C) (Oral)   Resp 18   SpO2 95%   Physical Exam Vitals and nursing note reviewed.  Constitutional:      Appearance: Normal appearance.  HENT:     Head: Normocephalic and atraumatic.  Eyes:     General: No scleral icterus.    Conjunctiva/sclera: Conjunctivae normal.  Pulmonary:     Effort: Pulmonary effort is normal. No respiratory distress.  Skin:    Findings: No rash.  Neurological:     Mental Status: He is alert.  Psychiatric:        Mood and Affect: Mood normal.    Procedures  Procedures  ED Course / MDM   Clinical Course as of 01/19/22 Terie Purser Jan 19, 2022  0022 Basic metabolic panel(!) [MR]    Clinical Course User Index [MR] Saddie Benders, PA-C   Medical Decision Making Amount and/or Complexity of Data Reviewed Labs: ordered.  Risk Prescription drug management.   Second BMP reviewed and interpreted by me.  Now with normal creatinine, down from 1.62.  Bicarb 19, improved from 15.  Blood glucose 271, down from 363.  Normal GFR.  Patient will be discharged at this time.  He will follow-up with his PCP next week, appointment already established.  He is agreeable to this.      Woodroe Chen 01/19/22 0023    Dione Booze, MD 01/19/22 [phone]

## 2022-01-19 LAB — BASIC METABOLIC PANEL
Anion gap: 13 (ref 5–15)
BUN: 19 mg/dL (ref 8–23)
CO2: 19 mmol/L — ABNORMAL LOW (ref 22–32)
Calcium: 9 mg/dL (ref 8.9–10.3)
Chloride: 108 mmol/L (ref 98–111)
Creatinine, Ser: 1.14 mg/dL (ref 0.61–1.24)
GFR, Estimated: >60 mL/min (ref >60)
Glucose, Bld: 271 mg/dL — ABNORMAL HIGH (ref 70–99)
Potassium: 4 mmol/L (ref 3.5–5.1)
Sodium: 140 mmol/L (ref 135–145)

## 2022-01-19 NOTE — Discharge Instructions (Signed)
Please follow-up with your PCP next week.

## 2022-01-22 ENCOUNTER — Emergency Department (HOSPITAL_COMMUNITY): Payer: Medicare Other

## 2022-01-22 ENCOUNTER — Other Ambulatory Visit: Payer: Self-pay

## 2022-01-22 ENCOUNTER — Emergency Department (HOSPITAL_COMMUNITY)
Admission: EM | Admit: 2022-01-22 | Discharge: 2022-01-22 | Disposition: A | Discharge status: Home / Self Care | Payer: Medicare Other | Attending: Emergency Medicine | Admitting: Emergency Medicine

## 2022-01-22 DIAGNOSIS — Z743 Need for continuous supervision: Secondary | ICD-10-CM | POA: Diagnosis not present

## 2022-01-22 DIAGNOSIS — I10 Essential (primary) hypertension: Secondary | ICD-10-CM | POA: Insufficient documentation

## 2022-01-22 DIAGNOSIS — Z7901 Long term (current) use of anticoagulants: Secondary | ICD-10-CM | POA: Insufficient documentation

## 2022-01-22 DIAGNOSIS — E119 Type 2 diabetes mellitus without complications: Secondary | ICD-10-CM | POA: Insufficient documentation

## 2022-01-22 DIAGNOSIS — E1165 Type 2 diabetes mellitus with hyperglycemia: Secondary | ICD-10-CM | POA: Diagnosis not present

## 2022-01-22 DIAGNOSIS — R000 Tachycardia, unspecified: Secondary | ICD-10-CM | POA: Diagnosis not present

## 2022-01-22 DIAGNOSIS — R6889 Other general symptoms and signs: Secondary | ICD-10-CM | POA: Diagnosis not present

## 2022-01-22 DIAGNOSIS — Z7984 Long term (current) use of oral hypoglycemic drugs: Secondary | ICD-10-CM | POA: Insufficient documentation

## 2022-01-22 DIAGNOSIS — E139 Other specified diabetes mellitus without complications: Secondary | ICD-10-CM

## 2022-01-22 DIAGNOSIS — I4891 Unspecified atrial fibrillation: Secondary | ICD-10-CM

## 2022-01-22 DIAGNOSIS — I499 Cardiac arrhythmia, unspecified: Secondary | ICD-10-CM | POA: Diagnosis not present

## 2022-01-22 LAB — CBC WITH DIFFERENTIAL/PLATELET
Abs Immature Granulocytes: 0.07 10*3/uL (ref 0.00–0.07)
Basophils Absolute: 0.1 10*3/uL (ref 0.0–0.1)
Basophils Relative: 1 %
Eosinophils Absolute: 0.1 10*3/uL (ref 0.0–0.5)
Eosinophils Relative: 2 %
HCT: 44.5 % (ref 39.0–52.0)
Hemoglobin: 15 g/dL (ref 13.0–17.0)
Immature Granulocytes: 1 %
Lymphocytes Relative: 52 %
Lymphs Abs: 4.3 10*3/uL — ABNORMAL HIGH (ref 0.7–4.0)
MCH: 29.9 pg (ref 26.0–34.0)
MCHC: 33.7 g/dL (ref 30.0–36.0)
MCV: 88.6 fL (ref 80.0–100.0)
Monocytes Absolute: 0.8 10*3/uL (ref 0.1–1.0)
Monocytes Relative: 10 %
Neutro Abs: 2.7 10*3/uL (ref 1.7–7.7)
Neutrophils Relative %: 34 %
Platelets: 145 10*3/uL — ABNORMAL LOW (ref 150–400)
RBC: 5.02 MIL/uL (ref 4.22–5.81)
RDW: 13.5 % (ref 11.5–15.5)
WBC: 8 10*3/uL (ref 4.0–10.5)
nRBC: 0 % (ref 0.0–0.2)

## 2022-01-22 LAB — COMPREHENSIVE METABOLIC PANEL
ALT: 64 U/L — ABNORMAL HIGH (ref 0–44)
AST: 23 U/L (ref 15–41)
Albumin: 3.2 g/dL — ABNORMAL LOW (ref 3.5–5.0)
Alkaline Phosphatase: 85 U/L (ref 38–126)
Anion gap: 13 (ref 5–15)
BUN: 17 mg/dL (ref 8–23)
CO2: 21 mmol/L — ABNORMAL LOW (ref 22–32)
Calcium: 8.7 mg/dL — ABNORMAL LOW (ref 8.9–10.3)
Chloride: 100 mmol/L (ref 98–111)
Creatinine, Ser: 1.14 mg/dL (ref 0.61–1.24)
GFR, Estimated: >60 mL/min (ref >60)
Glucose, Bld: 187 mg/dL — ABNORMAL HIGH (ref 70–99)
Potassium: 3.7 mmol/L (ref 3.5–5.1)
Sodium: 134 mmol/L — ABNORMAL LOW (ref 135–145)
Total Bilirubin: 0.8 mg/dL (ref 0.3–1.2)
Total Protein: 5.8 g/dL — ABNORMAL LOW (ref 6.5–8.1)

## 2022-01-22 LAB — I-STAT VENOUS BLOOD GAS, ED
Acid-base deficit: 2 mmol/L (ref 0.0–2.0)
Bicarbonate: 22.6 mmol/L (ref 20.0–28.0)
Calcium, Ion: 1.09 mmol/L — ABNORMAL LOW (ref 1.15–1.40)
HCT: 45 % (ref 39.0–52.0)
Hemoglobin: 15.3 g/dL (ref 13.0–17.0)
O2 Saturation: 100 %
Potassium: 3.7 mmol/L (ref 3.5–5.1)
Sodium: 134 mmol/L — ABNORMAL LOW (ref 135–145)
TCO2: 24 mmol/L (ref 22–32)
pCO2, Ven: 36.6 mmHg — ABNORMAL LOW (ref 44–60)
pH, Ven: 7.4 (ref 7.25–7.43)
pO2, Ven: 174 mmHg — ABNORMAL HIGH (ref 32–45)

## 2022-01-22 MED ORDER — METOPROLOL TARTRATE 25 MG PO TABS
12.5000 mg | ORAL_TABLET | Freq: Once | ORAL | Status: AC
  Administered 2022-01-22: 12.5 mg via ORAL
  Filled 2022-01-22: qty 1

## 2022-01-22 MED ORDER — APIXABAN 5 MG PO TABS: 5.0000 mg | ORAL_TABLET | Freq: Two times a day (BID) | ORAL | 0 refills | Status: DC

## 2022-01-22 MED ORDER — LACTATED RINGERS IV BOLUS
1000.0000 mL | Freq: Once | INTRAVENOUS | Status: AC
  Administered 2022-01-22: 1000 mL via INTRAVENOUS

## 2022-01-22 MED ORDER — APIXABAN 5 MG PO TABS
5.0000 mg | ORAL_TABLET | Freq: Once | ORAL | Status: AC
  Administered 2022-01-22: 5 mg via ORAL
  Filled 2022-01-22: qty 1

## 2022-01-22 MED ORDER — METOPROLOL TARTRATE 25 MG PO TABS: 12.5000 mg | ORAL_TABLET | Freq: Two times a day (BID) | ORAL | 0 refills | Status: DC

## 2022-01-22 NOTE — Discharge Instructions (Addendum)
You were given 2 new prescriptions today.  The first 1 was a blood thinner called Eliquis and that is to prevent any type of stroke related to the atrial fibrillation.  The other medication is called metoprolol and is supposed to help keep your heart rate more controlled.  Check your heart rate before taking the medication and if the heart rate is less than 60 do not use the medication.  Return if you start having rapid heart rate, chest pain, shortness of breath or passing out.  Keep up the good work with your diabetic diet.   Information on my medicine - ELIQUIS (apixaban)  This medication education was reviewed with me or my healthcare representative as part of my discharge preparation.  Why was Eliquis prescribed for you? Eliquis was prescribed for you to reduce the risk of a blood clot forming that can cause a stroke if you have a medical condition called atrial fibrillation (a type of irregular heartbeat).  What do You need to know about Eliquis ? Take your Eliquis TWICE DAILY - one tablet in the morning and one tablet in the evening with or without food. If you have difficulty swallowing the tablet whole please discuss with your pharmacist how to take the medication safely.  Take Eliquis exactly as prescribed by your doctor and DO NOT stop taking Eliquis without talking to the doctor who prescribed the medication.  Stopping may increase your risk of developing a stroke.  Refill your prescription before you run out.  After discharge, you should have regular check-up appointments with your healthcare provider that is prescribing your Eliquis.  In the future your dose may need to be changed if your kidney function or weight changes by a significant amount or as you get older.  What do you do if you miss a dose? If you miss a dose, take it as soon as you remember on the same day and resume taking twice daily.  Do not take more than one dose of ELIQUIS at the same time to make up a missed  dose.  Important Safety Information A possible side effect of Eliquis is bleeding. You should call your healthcare provider right away if you experience any of the following: Bleeding from an injury or your nose that does not stop. Unusual colored urine (red or dark brown) or unusual colored stools (red or black). Unusual bruising for unknown reasons. A serious fall or if you hit your head (even if there is no bleeding).  Some medicines may interact with Eliquis and might increase your risk of bleeding or clotting while on Eliquis. To help avoid this, consult your healthcare provider or pharmacist prior to using any new prescription or non-prescription medications, including herbals, vitamins, non-steroidal anti-inflammatory drugs (NSAIDs) and supplements.  This website has more information on Eliquis (apixaban): http://www.eliquis.com/eliquis/home

## 2022-01-22 NOTE — ED Provider Notes (Signed)
[facility] EMERGENCY DEPARTMENT Provider Note   CSN: [id number] Arrival date & time: 01/22/22  1645     History  Chief Complaint  Patient presents with   Tachycardia    Justin [name] is a 66 y.o. male.  Patient is a 66 year old male with a history of hypertension, recent diagnosis of diabetes and hypercholesterolemia who was seen 2 times in the emergency room last week and given IV fluids due to feeling general malaise, polyuria and polydipsia.  He was started on metformin however reports that it was making him have nausea and vomiting so his doctor started him on Jardiance which he has taken now 2 doses.  He reports overall he has been feeling better.  He is starting to have more energy and denies any chest pain, shortness of breath, dizziness, syncope, vomiting or diarrhea.  He went to his PCP today for his follow-up visit to make sure the new medication was doing okay for him and he reports when they hooked him up his heart rate was very fast.  He does not recall that ever happening to him he has not noticed its been elevated and is not having any palpitations or shortness of breath.  He has been trying to stick with a diabetic diet.  The history is provided by the patient and medical records.      Home Medications Prior to Admission medications   Medication Sig Start Date End Date Taking? Authorizing Provider  apixaban (ELIQUIS) 5 MG TABS tablet Take 1 tablet (5 mg total) by mouth 2 (two) times daily. 01/22/22 02/21/22 Yes Blanchie Dessert, MD  metoprolol tartrate (LOPRESSOR) 25 MG tablet Take 0.5 tablets (12.5 mg total) by mouth 2 (two) times daily. 01/22/22  Yes Zelena Bushong, Loree Fee, MD  atorvastatin (LIPITOR) 10 MG tablet Take 1 tablet (10 mg total) by mouth daily. Patient not taking: Reported on 12/06/2021 07/13/20   Ladell Pier, MD  glipiZIDE (GLUCOTROL) 10 MG tablet Take 1 tablet (10 mg total) by mouth daily before breakfast. 01/17/22   Molpus, John, MD   meloxicam (MOBIC) 15 MG tablet TAKE 1 TABLET(15 MG) BY MOUTH DAILY Patient not taking: Reported on 12/06/2021 08/16/20   Ladell Pier, MD  metFORMIN (GLUCOPHAGE) 500 MG tablet Take 1 tablet (500 mg total) by mouth daily with breakfast. Patient not taking: Reported on 12/06/2021 07/13/20   Ladell Pier, MD  tamsulosin (FLOMAX) 0.4 MG CAPS capsule Take 1 capsule (0.4 mg total) by mouth daily. Patient not taking: Reported on 07/13/2020 12/21/17   Ladell Pier, MD      Allergies    Patient has no known allergies.    Review of Systems   Review of Systems  Physical Exam Updated Vital Signs BP 98/74   Pulse 82   Temp 98.1 F (36.7 C) (Oral)   Resp 17   Ht '5\' 11"'$  (1.803 m)   Wt 127.9 kg   SpO2 99%   BMI 39.33 kg/m  Physical Exam Vitals and nursing note reviewed.  Constitutional:      General: He is not in acute distress.    Appearance: He is well-developed.  HENT:     Head: Normocephalic and atraumatic.     Mouth/Throat:     Mouth: Mucous membranes are dry.  Eyes:     Conjunctiva/sclera: Conjunctivae normal.     Pupils: Pupils are equal, round, and reactive to light.  Cardiovascular:     Rate and Rhythm: Regular rhythm. Tachycardia present.  Heart sounds: No murmur heard. Pulmonary:     Effort: Pulmonary effort is normal. No respiratory distress.     Breath sounds: Normal breath sounds. No wheezing or rales.  Abdominal:     General: There is no distension.     Palpations: Abdomen is soft.     Tenderness: There is no abdominal tenderness. There is no guarding or rebound.  Musculoskeletal:        General: No tenderness. Normal range of motion.     Cervical back: Normal range of motion and neck supple.     Right lower leg: No edema.     Left lower leg: No edema.  Skin:    General: Skin is warm and dry.     Findings: No erythema or rash.  Neurological:     Mental Status: He is alert and oriented to person, place, and time.  Psychiatric:         Behavior: Behavior normal.    ED Results / Procedures / Treatments   Labs (all labs ordered are listed, but only abnormal results are displayed) Labs Reviewed  CBC WITH DIFFERENTIAL/PLATELET - Abnormal; Notable for the following components:      Result Value   Platelets 145 (*)    Lymphs Abs 4.3 (*)    All other components within normal limits  COMPREHENSIVE METABOLIC PANEL - Abnormal; Notable for the following components:   Sodium 134 (*)    CO2 21 (*)    Glucose, Bld 187 (*)    Calcium 8.7 (*)    Total Protein 5.8 (*)    Albumin 3.2 (*)    ALT 64 (*)    All other components within normal limits  I-STAT VENOUS BLOOD GAS, ED - Abnormal; Notable for the following components:   pCO2, Ven 36.6 (*)    pO2, Ven 174 (*)    Sodium 134 (*)    Calcium, Ion 1.09 (*)    All other components within normal limits    EKG EKG Interpretation  Date/Time:  Wednesday Jan 22 2022 18:48:20 EDT Ventricular Rate:  96 PR Interval:  145 QRS Duration: 105 QT Interval:  321 QTC Calculation: 406 R Axis:   58 Text Interpretation: new Atrial fibrillation Abnormal R-wave progression, early transition Borderline T abnormalities, diffuse leads Confirmed by Blanchie Dessert ([id number]) on 01/22/2022 7:17:08 PM  Radiology DG Chest Port 1 View  Result Date: 01/22/2022 CLINICAL DATA:  Tachycardia EXAM: PORTABLE CHEST 1 VIEW COMPARISON:  Radiograph 03/03/2017 FINDINGS: Unchanged cardiomediastinal silhouette. New focal opacity in the left medial basilar chest. There is no large pleural effusion. There is no visible pneumothorax. There is no acute osseous abnormality. Thoracic spondylosis. IMPRESSION: New left medial basilar opacity, could be developing infection. Electronically Signed   By: Maurine Simmering M.D.   On: 01/22/2022 17:46    Procedures Procedures    Medications Ordered in ED Medications  apixaban (ELIQUIS) tablet 5 mg (has no administration in time range)  lactated ringers bolus 1,000 mL (0 mLs  Intravenous Stopped 01/22/22 1926)  metoprolol tartrate (LOPRESSOR) tablet 12.5 mg (12.5 mg Oral Given 01/22/22 1932)    ED Course/ Medical Decision Making/ A&P                           Medical Decision Making Amount and/or Complexity of Data Reviewed Labs: ordered. Radiology: ordered.  Risk Prescription drug management.   Pt with multiple medical problems and comorbidities and presenting today with a complaint  that caries a high risk for morbidity and mortality.  Presenting today with tachycardia.  Patient's EKG looks most consistent with a flutter with a 2-1 conduction.  Patient is asymptomatic at this time and well-appearing.  Blood pressure is normal.  Patient has been seen twice in the last 7 days due to elevated blood sugars recent diagnosis of diabetes and had been on metformin and then was changed over to Jardiance because metformin was causing vomiting.  Patient is not displaying active signs concerning for DKA at this time.  However will confirm no electrolyte abnormalities.  Blood sugar was in the 200s when checked by EMS and patient got 370 prior to his doctor's appointment.  At PCP office patient was found to have a heart rate at 150 bpm.  Patient did not have any palpitations or acute cardiac complaints at the office or now.  We will give a fluid bolus and reassess.  Patient remains tachycardic will give medication.  Labs are pending.  Low suspicion for ischemia at this time.  9:10 PM I independently interpreted patient's labs and EKG.  After patient received 1 L of fluid heart rate improved to 110 and repeat EKG showed atrial fibrillation.  This is the first time patient has ever been diagnosed with A-fib.  He was given a oral dose of metoprolol to ensure he has rate control.  CHA2DS2-VASc Score = 3   The patient's score is based upon:      Pt will need anticoagulation.   Patient's labs today show improved blood sugar of 187 and normal creatinine, normal CBC.  No evidence of DKA  no anion gap and VBG with normal pH.  Patient will need follow-up in the A-fib clinic.  No indication for admission at this time. Pt was able to get up and walk to the bathroom and reports feeling fine.        Signed,  Blanchie Dessert, MD    01/22/2022 9:10 PM            Final Clinical Impression(s) / ED Diagnoses Final diagnoses:  Atrial fibrillation with RVR ([place])  Other specified diabetes mellitus without complication, without long-term current use of insulin ([place])  Primary hypertension    Rx / DC Orders ED Discharge Orders          Ordered    metoprolol tartrate (LOPRESSOR) 25 MG tablet  2 times daily        01/22/22 2109    apixaban (ELIQUIS) 5 MG TABS tablet  2 times daily        01/22/22 2109    Amb Referral to AFIB Clinic        01/22/22 2110              Blanchie Dessert, MD 01/22/22 2110

## 2022-01-22 NOTE — ED Triage Notes (Signed)
Pt bib [facility] from his PCP office as a follow up from 2 recent hospital visits. Pt recently hospitalized for hyperglycemia. Pt says he is not diagnosed as a diabetic, and they're keeping an eye out on his A1C. He was getting an echo his Hr 150s, palpable his HR is 80s. PCP called EMS. His only complaint is dry mouth today.   EMS vitals 114/88 BP 80 HR palpable 150s Cardiac monitor 98% O2 208 CBG 20g Lt hand

## 2022-02-12 DIAGNOSIS — D6869 Other thrombophilia: Secondary | ICD-10-CM | POA: Diagnosis not present

## 2022-02-12 DIAGNOSIS — I480 Paroxysmal atrial fibrillation: Secondary | ICD-10-CM | POA: Diagnosis not present

## 2022-02-12 DIAGNOSIS — I119 Hypertensive heart disease without heart failure: Secondary | ICD-10-CM | POA: Diagnosis not present

## 2022-02-12 DIAGNOSIS — Z79899 Other long term (current) drug therapy: Secondary | ICD-10-CM | POA: Diagnosis not present

## 2022-02-12 DIAGNOSIS — E1169 Type 2 diabetes mellitus with other specified complication: Secondary | ICD-10-CM | POA: Diagnosis not present

## 2022-02-12 DIAGNOSIS — Z0001 Encounter for general adult medical examination with abnormal findings: Secondary | ICD-10-CM | POA: Diagnosis not present

## 2022-02-12 DIAGNOSIS — I10 Essential (primary) hypertension: Secondary | ICD-10-CM | POA: Diagnosis not present

## 2022-02-12 DIAGNOSIS — I509 Heart failure, unspecified: Secondary | ICD-10-CM | POA: Diagnosis not present

## 2022-02-12 DIAGNOSIS — Z7189 Other specified counseling: Secondary | ICD-10-CM | POA: Diagnosis not present

## 2022-02-18 ENCOUNTER — Telehealth: Payer: Self-pay

## 2022-02-18 NOTE — Telephone Encounter (Signed)
Spoke with pt and he is aware of Dr. Lauro Franklin recommendations. Appt cancelled. Pt knows to contact our office once he has been seen by cardiology so colon can be rescheduled. [facility], [place], notified via fax also.

## 2022-02-26 ENCOUNTER — Encounter: Payer: Medicare Other | Admitting: Internal Medicine

## 2022-04-09 ENCOUNTER — Encounter: Payer: Self-pay | Admitting: Cardiovascular Disease

## 2022-04-09 ENCOUNTER — Ambulatory Visit (INDEPENDENT_AMBULATORY_CARE_PROVIDER_SITE_OTHER): Payer: Medicare Other | Admitting: Cardiovascular Disease

## 2022-04-09 ENCOUNTER — Encounter: Payer: Self-pay | Admitting: *Deleted

## 2022-04-09 ENCOUNTER — Other Ambulatory Visit: Payer: Self-pay | Admitting: Cardiovascular Disease

## 2022-04-09 DIAGNOSIS — I480 Paroxysmal atrial fibrillation: Secondary | ICD-10-CM | POA: Diagnosis not present

## 2022-04-09 DIAGNOSIS — I10 Essential (primary) hypertension: Secondary | ICD-10-CM

## 2022-04-09 DIAGNOSIS — E669 Obesity, unspecified: Secondary | ICD-10-CM

## 2022-04-09 DIAGNOSIS — Z720 Tobacco use: Secondary | ICD-10-CM

## 2022-04-09 DIAGNOSIS — E785 Hyperlipidemia, unspecified: Secondary | ICD-10-CM

## 2022-04-09 LAB — LIPID PANEL
Chol/HDL Ratio: 3.3 ratio (ref 0.0–5.0)
Cholesterol, Total: 162 mg/dL (ref 100–199)
HDL: 49 mg/dL (ref >39)
LDL Chol Calc (NIH): 86 mg/dL (ref 0–99)
Triglycerides: 155 mg/dL — ABNORMAL HIGH (ref 0–149)
VLDL Cholesterol Cal: 27 mg/dL (ref 5–40)

## 2022-04-09 LAB — HEPATIC FUNCTION PANEL
ALT: 20 IU/L (ref 0–44)
AST: 16 IU/L (ref 0–40)
Albumin: 4.3 g/dL (ref 3.9–4.9)
Alkaline Phosphatase: 95 IU/L (ref 44–121)
Bilirubin Total: <0.2 mg/dL (ref 0.0–1.2)
Bilirubin, Direct: <0.1 mg/dL (ref 0.00–0.40)
Total Protein: 6.5 g/dL (ref 6.0–8.5)

## 2022-04-09 NOTE — Progress Notes (Signed)
04/09/2022 Justin [name]   [birth year]  [id number]  Primary Physician Ladell Pier, MD Primary Cardiologist: Lorretta Harp MD Lupe Carney, [place]  HPI:  Justin [name] is a 66 y.o. morbidly overweight separated African-American male father of 2 daughters, grandfather of 2 grandsons referred by Cletis Athens, NP for cardiovascular valuation because of recent episode of A-fib.  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  His cardiac risk factors include discontinue tobacco abuse, treated hypertension, diabetes and hyperlipidemia.  His father died of a myocardial infarction in his 55s and his brother died of myocardial infarction at age 76.  He is never had a heart attack or stroke.  He denies chest pain or shortness of breath.  He does walk without limitation.  He was recently seen in the ER on 01/22/2022 with A-fib and was placed on Eliquis.  He is unaware of whether he has been in A-fib in the past.  He denies symptoms of obstructive sleep apnea.   Current Meds  Medication Sig   ACCU-CHEK GUIDE test strip USE AS DIRECTED TO CHECK BLOOD SUGAR DAILY   Accu-Chek Softclix Lancets lancets daily.   apixaban (ELIQUIS) 5 MG TABS tablet Take 1 tablet (5 mg total) by mouth 2 (two) times daily.   atorvastatin (LIPITOR) 40 MG tablet Take 40 mg by mouth daily.   Blood Glucose Monitoring Suppl (ACCU-CHEK GUIDE) w/Device KIT CHECK BLOOD SUGAR ONE TIME A DAY   glipiZIDE (GLUCOTROL) 10 MG tablet Take 1 tablet (10 mg total) by mouth daily before breakfast.   JARDIANCE 10 MG TABS tablet Take 10 mg by mouth daily.   losartan (COZAAR) 25 MG tablet Take 12.5 mg by mouth daily.   meloxicam (MOBIC) 15 MG tablet TAKE 1 TABLET(15 MG) BY MOUTH DAILY   metFORMIN (GLUCOPHAGE) 500 MG tablet Take 1 tablet (500 mg total) by mouth daily with breakfast.   metoprolol succinate (TOPROL-XL) 25 MG 24 hr tablet Take 25 mg by mouth daily.   tamsulosin (FLOMAX)  0.4 MG CAPS capsule Take 1 capsule (0.4 mg total) by mouth daily.     No Known Allergies  Social History   Socioeconomic History   Marital status: Legally Separated    Spouse name: Not on file   Number of children: Not on file   Years of education: Not on file   Highest education level: Not on file  Occupational History   Not on file  Tobacco Use   Smoking status: Former    Packs/day: 0.25    Types: Cigarettes   Smokeless tobacco: Never  Vaping Use   Vaping Use: Never used  Substance and Sexual Activity   Alcohol use: Yes    Alcohol/week: 2.0 - 3.0 standard drinks of alcohol    Types: 2 - 3 Cans of beer per week    Comment: occasional   Drug use: Not Currently   Sexual activity: Not on file  Other Topics Concern   Not on file  Social History Narrative   Not on file   Social Determinants of Health   Financial Resource Strain: Not on file  Food Insecurity: Not on file  Transportation Needs: Not on file  Physical Activity: Not on file  Stress: Not on file  Social Connections: Not on file  Intimate Partner Violence: Not on file     Review of Systems: General: negative for chills,  fever, night sweats or weight changes.  Cardiovascular: negative for chest pain, dyspnea on exertion, edema, orthopnea, palpitations, paroxysmal nocturnal dyspnea or shortness of breath Dermatological: negative for rash Respiratory: negative for cough or wheezing Urologic: negative for hematuria Abdominal: negative for nausea, vomiting, diarrhea, bright red blood per rectum, melena, or hematemesis Neurologic: negative for visual changes, syncope, or dizziness All other systems reviewed and are otherwise negative except as noted above.    Blood pressure 112/68, pulse (!) 59, height 5' 11"  (1.803 m), weight 279 lb 12.8 oz (126.9 kg), SpO2 94 %.  General appearance: alert and no distress Neck: no adenopathy, no carotid bruit, no JVD, supple, symmetrical, trachea midline, and thyroid not  enlarged, symmetric, no tenderness/mass/nodules Lungs: clear to auscultation bilaterally Heart: regular rate and rhythm, S1, S2 normal, no murmur, click, rub or gallop Extremities: extremities normal, atraumatic, no cyanosis or edema Pulses: 2+ and symmetric Skin: Skin color, texture, turgor normal. No rashes or lesions Neurologic: Grossly normal  EKG sinus bradycardia 59 with nonspecific ST and T wave changes.  I personally reviewed this EKG.  ASSESSMENT AND PLAN:   Dyslipidemia History of dyslipidemia low-dose statin therapy with recent lipid profile performed by his PCP 07/14/2021 revealing total cholesterol 253, LDL 175 and HDL 48.  I am going to repeat a lipid liver profile this morning and get a coronary calcium score to help guide how aggressive baby to be with lipid management  Obesity (BMI 30-39.9) BMI of 39.  Will refer to [facility] for physician facilitated weight loss.  Tobacco abuse Remote tobacco abuse having quit 5 years ago and smoked 1 pack a week for approximately 40 years.  Essential hypertension History of essential hypertension blood pressure measured today at 112/68.  He is on losartan and metoprolol.  Paroxysmal atrial fibrillation ([place]) Patient was found to be in PAF on 01/22/2022 in the emergency room.  He was placed on Eliquis oral anticoagulation.  He he was unaware that he was in A-fib at the time.  He is currently in sinus rhythm on metoprolol.This patients CHA2DS2-VASc Score and unadjusted Ischemic Stroke Rate (% per year) is equal to 3.2 % stroke rate/year from a score of 3  Above score calculated as 1 point each if present [CHF, HTN, DM, Vascular=MI/PAD/Aortic Plaque, Age if 65-74, or Male] Above score calculated as 2 points each if present [Age > 75, or Stroke/TIA/TE]  At this point, because he has only had 1 episode of PAF I do not feel that he should be committed to lifelong anticoagulation.  I am going to get a 30-day event monitor to  further evaluate.     Lorretta Harp MD FACP,FACC,FAHA, [facility] 04/09/2022 10:01 AM

## 2022-04-09 NOTE — Patient Instructions (Signed)
Medication Instructions:  Your physician recommends that you continue on your current medications as directed. Please refer to the Current Medication list given to you today.  *If you need a refill on your cardiac medications before your next appointment, please call your pharmacy*   Lab Work: Your physician recommends that you have labs drawn today: Lipid/liver panel  If you have labs (blood work) drawn today and your tests are completely normal, you will receive your results only by: MyChart Message (if you have MyChart) OR A paper copy in the mail If you have any lab test that is abnormal or we need to change your treatment, we will call you to review the results.   Testing/Procedures: Your physician has requested that you have an echocardiogram. Echocardiography is a painless test that uses sound waves to create images of your heart. It provides your doctor with information about the size and shape of your heart and how well your heart's chambers and valves are working. This procedure takes approximately one hour. There are no restrictions for this procedure. This procedure will be done at [facility], 2nd Floor   Dr. Gwenlyn Found has ordered a CT coronary calcium score.   Test locations:   [facility], 2nd Floor)  This is $99 out of pocket.   Coronary CalciumScan A coronary calcium scan is an imaging test used to look for deposits of calcium and other fatty materials (plaques) in the inner lining of the blood vessels of the heart (coronary arteries). These deposits of calcium and plaques can partly clog and narrow the coronary arteries without producing any symptoms or warning signs. This puts a person at risk for a heart attack. This test can detect these deposits before symptoms develop. Tell a health care provider about: Any allergies you have. All medicines you are taking, including vitamins, herbs, eye drops, creams, and  over-the-counter medicines. Any problems you or family members have had with anesthetic medicines. Any blood disorders you have. Any surgeries you have had. Any medical conditions you have. Whether you are pregnant or may be pregnant. What are the risks? Generally, this is a safe procedure. However, problems may occur, including: Harm to a pregnant woman and her unborn baby. This test involves the use of radiation. Radiation exposure can be dangerous to a pregnant woman and her unborn baby. If you are pregnant, you generally should not have this procedure done. Slight increase in the risk of cancer. This is because of the radiation involved in the test. What happens before the procedure? No preparation is needed for this procedure. What happens during the procedure? You will undress and remove any jewelry around your neck or chest. You will put on a hospital gown. Sticky electrodes will be placed on your chest. The electrodes will be connected to an electrocardiogram (ECG) machine to record a tracing of the electrical activity of your heart. A CT scanner will take pictures of your heart. During this time, you will be asked to lie still and hold your breath for 2-3 seconds while a picture of your heart is being taken. The procedure may vary among health care providers and hospitals. What happens after the procedure? You can get dressed. You can return to your normal activities. It is up to you to get the results of your test. Ask your health care provider, or the department that is doing the test, when your results will be ready. Summary A coronary calcium scan is an imaging test used  to look for deposits of calcium and other fatty materials (plaques) in the inner lining of the blood vessels of the heart (coronary arteries). Generally, this is a safe procedure. Tell your health care provider if you are pregnant or may be pregnant. No preparation is needed for this procedure. A CT scanner  will take pictures of your heart. You can return to your normal activities after the scan is done. This information is not intended to replace advice given to you by your health care provider. Make sure you discuss any questions you have with your health care provider. Document Released: 02/07/2008 Document Revised: 06/30/2016 Document Reviewed: 06/30/2016 Elsevier Interactive Patient Education  2017 [place] Cardiac Event Monitor Instructions Your physician has requested you wear your cardiac event monitor for 30 days. [employer] may call or text to confirm a shipping address. The monitor will be sent to a land address via UPS. [employer] will not ship a monitor to a PO BOX. It typically takes 3-5 days to receive your monitor after it has been enrolled. [employer] will assist with USPS tracking if your package is delayed. The telephone number for [employer] is [phone]. Once you have received your monitor, please review the enclosed instructions. Instruction tutorials can also be viewed under help and settings on the enclosed cell phone. Your monitor has already been registered assigning a specific monitor serial # to you.  Applying the monitor Remove cell phone from case and turn it on. The cell phone works as Dealer and needs to be within [employer] of you at all times. The cell phone will need to be charged on a daily basis. We recommend you plug the cell phone into the enclosed charger at your bedside table every night.  Monitor batteries: You will receive two monitor batteries labelled #1 and #2. These are your recorders. Plug battery #2 onto the second connection on the enclosed charger. Keep one battery on the charger at all times. This will keep the monitor battery deactivated. It will also keep it fully charged for when you need to switch your monitor batteries. A small light will be blinking on the battery emblem when it is charging. The light on  the battery emblem will remain on when the battery is fully charged.  Open package of a Monitor strip. Insert battery #1 into black hood on strip and gently squeeze monitor battery onto connection as indicated in instruction booklet. Set aside while preparing skin.  Choose location for your strip, vertical or horizontal, as indicated in the instruction booklet. Shave to remove all hair from location. There cannot be any lotions, oils, powders, or colognes on skin where monitor is to be applied. Wipe skin clean with enclosed Saline wipe. Dry skin completely.  Peel paper labeled #1 off the back of the Monitor strip exposing the adhesive. Place the monitor on the chest in the vertical or horizontal position shown in the instruction booklet. One arrow on the monitor strip must be pointing upward. Carefully remove paper labeled #2, attaching remainder of strip to your skin. Try not to create any folds or wrinkles in the strip as you apply it.  Firmly press and release the circle in the center of the monitor battery. You will hear a small beep. This is turning the monitor battery on. The heart emblem on the monitor battery will light up every 5 seconds if the monitor battery in turned on and connected to the patient securely. Do not push and hold  the circle down as this turns the monitor battery off. The cell phone will locate the monitor battery. A screen will appear on the cell phone checking the connection of your monitor strip. This may read poor connection initially but change to good connection within the next minute. Once your monitor accepts the connection you will hear a series of 3 beeps followed by a climbing crescendo of beeps. A screen will appear on the cell phone showing the two monitor strip placement options. Touch the picture that demonstrates where you applied the monitor strip.  Your monitor strip and battery are waterproof. You are able to shower, bathe, or swim with the  monitor on. They just ask you do not submerge deeper than 3 feet underwater. We recommend removing the monitor if you are swimming in a lake, river, or ocean.  Your monitor battery will need to be switched to a fully charged monitor battery approximately once a week. The cell phone will alert you of an action which needs to be made.  On the cell phone, tap for details to reveal connection status, monitor battery status, and cell phone battery status. The green dots indicates your monitor is in good status. A red dot indicates there is something that needs your attention.  To record a symptom, click the circle on the monitor battery. In 30-60 seconds a list of symptoms will appear on the cell phone. Select your symptom and tap save. Your monitor will record a sustained or significant arrhythmia regardless of you clicking the button. Some patients do not feel the heart rhythm irregularities. [employer] will notify us of any serious or critical events.  Refer to instruction booklet for instructions on switching batteries, changing strips, the Do not disturb or Pause features, or any additional questions.  Call [employer] at [phone], to confirm your monitor is transmitting and record your baseline. They will answer any questions you may have regarding the monitor instructions at that time.  Returning the monitor to [place] all equipment back into blue box. Peel off strip of paper to expose adhesive and close box securely. There is a prepaid UPS shipping label on this box. Drop in a UPS drop box, or at a UPS facility like [employer]. You may also contact [employer] to arrange UPS to pick up monitor package at your home.    Follow-Up: At [facility], you and your health needs are our priority.  As part of our continuing mission to provide you with exceptional heart care, we have created designated Provider Care Teams.  These Care Teams include your primary Cardiologist  (physician) and Advanced Practice Providers (APPs -  Physician Assistants and Nurse Practitioners) who all work together to provide you with the care you need, when you need it.  We recommend signing up for the patient portal called "MyChart".  Sign up information is provided on this After Visit Summary.  MyChart is used to connect with patients for Virtual Visits (Telemedicine).  Patients are able to view lab/test results, encounter notes, upcoming appointments, etc.  Non-urgent messages can be sent to your provider as well.   To learn more about what you can do with MyChart, go to [email or web address].    Your next appointment:   3 month(s)  The format for your next appointment:   In Person  Provider:   Fabian Sharp, PA-C, Sande Rives, PA-C, Caron Presume, PA-C, Jory Sims, DNP, ANP, Almyra Deforest, PA-C, or Diona Browner, NP       Then, Quay Burow,  MD will plan to see you again in 6 month(s).

## 2022-04-09 NOTE — Progress Notes (Unsigned)
Patient ID: Justin [name], male   DOB: [birth year], 66 y.o.   MRN: [id number] Patient enrolled for [employer] to ship a 30 day cardiac event monitor to his address on file.

## 2022-04-09 NOTE — Assessment & Plan Note (Signed)
History of essential hypertension blood pressure measured today at 112/68.  He is on losartan and metoprolol.

## 2022-04-09 NOTE — Assessment & Plan Note (Signed)
Patient was found to be in PAF on 01/22/2022 in the emergency room.  He was placed on Eliquis oral anticoagulation.  He he was unaware that he was in A-fib at the time.  He is currently in sinus rhythm on metoprolol.This patients CHA2DS2-VASc Score and unadjusted Ischemic Stroke Rate (% per year) is equal to 3.2 % stroke rate/year from a score of 3  Above score calculated as 1 point each if present [CHF, HTN, DM, Vascular=MI/PAD/Aortic Plaque, Age if 65-74, or Male] Above score calculated as 2 points each if present [Age > 75, or Stroke/TIA/TE]  At this point, because he has only had 1 episode of PAF I do not feel that he should be committed to lifelong anticoagulation.  I am going to get a 30-day event monitor to further evaluate.

## 2022-04-09 NOTE — Assessment & Plan Note (Signed)
BMI of 39.  Will refer to [facility] for physician facilitated weight loss.

## 2022-04-09 NOTE — Assessment & Plan Note (Signed)
Remote tobacco abuse having quit 5 years ago and smoked 1 pack a week for approximately 40 years.

## 2022-04-09 NOTE — Assessment & Plan Note (Signed)
History of dyslipidemia low-dose statin therapy with recent lipid profile performed by his PCP 07/14/2021 revealing total cholesterol 253, LDL 175 and HDL 48.  I am going to repeat a lipid liver profile this morning and get a coronary calcium score to help guide how aggressive baby to be with lipid management

## 2022-04-24 ENCOUNTER — Ambulatory Visit (INDEPENDENT_AMBULATORY_CARE_PROVIDER_SITE_OTHER): Payer: Medicare Other

## 2022-04-24 DIAGNOSIS — I10 Essential (primary) hypertension: Secondary | ICD-10-CM

## 2022-04-24 DIAGNOSIS — Z720 Tobacco use: Secondary | ICD-10-CM

## 2022-04-24 DIAGNOSIS — E785 Hyperlipidemia, unspecified: Secondary | ICD-10-CM

## 2022-04-24 DIAGNOSIS — E669 Obesity, unspecified: Secondary | ICD-10-CM

## 2022-04-24 DIAGNOSIS — I480 Paroxysmal atrial fibrillation: Secondary | ICD-10-CM | POA: Diagnosis not present

## 2022-04-24 LAB — ECHOCARDIOGRAM COMPLETE
Area-P 1/2: 3.42 cm2
S' Lateral: 3.72 cm

## 2022-05-05 ENCOUNTER — Telehealth: Payer: Self-pay | Admitting: Cardiovascular Disease

## 2022-05-05 NOTE — Telephone Encounter (Signed)
Pt needs help putting heart monitor on

## 2022-05-06 NOTE — Telephone Encounter (Signed)
Monitor started 04/10/22 at [facility].   Kept getting poor skin contact messages.  [employer] called .  They will provide extension once patient comes in to have monitor apply.  Call Mitch to arrange. Patient takes arranges transportations, soonest available appointment is Monday, 05/12/2022, 9:00AM at our [place].

## 2022-05-12 ENCOUNTER — Encounter: Payer: Self-pay | Admitting: *Deleted

## 2022-05-12 ENCOUNTER — Ambulatory Visit: Payer: Medicare Other | Attending: Cardiovascular Disease

## 2022-05-12 DIAGNOSIS — E669 Obesity, unspecified: Secondary | ICD-10-CM | POA: Diagnosis not present

## 2022-05-12 DIAGNOSIS — I10 Essential (primary) hypertension: Secondary | ICD-10-CM | POA: Diagnosis not present

## 2022-05-12 DIAGNOSIS — I480 Paroxysmal atrial fibrillation: Secondary | ICD-10-CM

## 2022-05-12 DIAGNOSIS — Z720 Tobacco use: Secondary | ICD-10-CM | POA: Diagnosis not present

## 2022-05-12 NOTE — Progress Notes (Unsigned)
[id number]  from office inventory enrolled and applied.

## 2022-05-12 NOTE — Progress Notes (Signed)
Patient ID: Justin [name], male   DOB: April 15, 1956, 66 y.o.   MRN: [id number] Contact Evelena Asa at [place] on 05/08/22 requesting charges to be cancelled on original monitor.  Patient only recorded 7.5 hours of data and it was documented patient kept getting poor skin contact messages.  [employer] had previously offered a 5 day extension which is not adequate given unresolved problems with device. Patient had new monitor serial # [id number] enrolled and applied on 04/25/2022.

## 2022-07-09 NOTE — Progress Notes (Unsigned)
Cardiology Office Note:    Date:  07/10/2022   ID:  [name], DOB 1956/06/10, MRN [id number]  PCP:  Ladell Pier, MD   [place] Providers Cardiologist:  Quay Burow, MD     Referring MD: Ladell Pier, MD   CC: Here for follow-up  History of Present Illness:    EUSTACE HUR is a 66 y.o. male with a hx of the following:   PAF HTN T2DM Dyslipidemia Obesity Former Tobacco abuse   Initially evaluated by Dr. Gwenlyn Found in August 2023 at the request of Cletis Athens, NP for cardiovascular evaluation.  He was seen in the ER on Jan 22, 2022 with A-fib and was placed on Eliquis, he told Dr. Gwenlyn Found that he was unaware if he had been in A-fib in the past.  He denied any history of OSA.  2D complete echo 03/2022 revealed EF 55 to 60%, mild LVH, no RWMA, trivial MR. Recent Cardiac monitor 05/12/2022 revealed sinus rhythm, sinus bradycardia, sinus tachycardia without any arrhythmias noted. Was referred to diet and wellness physician for weight loss  Today he presents for follow-up.  He states he is doing well.  He denies any chest pain, does have some shortness of breath occasionally, but attributes this to his weight.  He has recently put on weight so he has joined a gym.  Says he is watching what he is eating and has changed his diet.  He attributes past shortness of breath to losing his brother and mother this past year and stress associated with that.  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  Stated he did run out of Eliquis for the past 2 weeks due to difficulty with insurance, does have 61-monthfree supply prescription card.  Denies any strokelike symptoms or palpitations, tachycardia, or problems with blood pressure.  Denies any syncope, presyncope, dizziness, orthopnea, PND, swelling, bleeding, or claudication.  Has positive family history for cardiovascular disease.  Denies any other questions or concerns today.   Past Medical History:  Diagnosis Date   A-fib ([facility]     Arthritis    states he was given med. for inflammation in his "bones" but he isn't taking it right now, remarks " i'm not going to to take something they just throw at me for no reason"   Diabetes mellitus ([facility]    Dyspnea    pt. reports this SOB worsens wioth increased heat    Former smoker    Hypercholesteremia    Hypertension    Obesity     Past Surgical History:  Procedure Laterality Date   BACK SURGERY  [facility]    MULTIPLE EXTRACTIONS WITH ALVEOLOPLASTY N/A 07/03/2017   Procedure: MULTIPLE EXTRACTION WITH ALVEOLOPLASTY;  Surgeon: JDiona Browner DDS;  [place] [facility]  Service: Oral Surgery;  Laterality: N/A;   TONSILLECTOMY     UMBILICAL HERNIA REPAIR      Current Medications: Current Meds  Medication Sig   ACCU-CHEK GUIDE test strip USE AS DIRECTED TO CHECK BLOOD SUGAR DAILY   Accu-Chek Softclix Lancets lancets daily.   atorvastatin (LIPITOR) 40 MG tablet Take 40 mg by mouth daily.   Blood Glucose Monitoring Suppl (ACCU-CHEK GUIDE) w/Device KIT CHECK BLOOD SUGAR ONE TIME A DAY   glipiZIDE (GLUCOTROL) 10 MG tablet Take 1 tablet (10 mg total) by mouth daily before breakfast.   JARDIANCE 10 MG TABS tablet Take 10 mg by mouth daily.   meloxicam (MOBIC) 15 MG tablet TAKE 1 TABLET(15 MG)  BY MOUTH DAILY   metFORMIN (GLUCOPHAGE) 500 MG tablet Take 1 tablet (500 mg total) by mouth daily with breakfast.   tamsulosin (FLOMAX) 0.4 MG CAPS capsule Take 1 capsule (0.4 mg total) by mouth daily.   [DISCONTINUED] apixaban (ELIQUIS) 5 MG TABS tablet Take 1 tablet (5 mg total) by mouth 2 (two) times daily.   [DISCONTINUED] ezetimibe (ZETIA) 10 MG tablet Take 1 tablet (10 mg total) by mouth daily.   [DISCONTINUED] losartan (COZAAR) 25 MG tablet Take 12.5 mg by mouth daily.   [DISCONTINUED] metoprolol succinate (TOPROL-XL) 25 MG 24 hr tablet Take 25 mg by mouth daily.     Allergies:   Patient has no known allergies.   Social History   Socioeconomic History   Marital status:  Legally Separated    Spouse name: Not on file   Number of children: Not on file   Years of education: Not on file   Highest education level: Not on file  Occupational History   Not on file  Tobacco Use   Smoking status: Former    Packs/day: 0.25    Types: Cigarettes   Smokeless tobacco: Never  Vaping Use   Vaping Use: Never used  Substance and Sexual Activity   Alcohol use: Yes    Alcohol/week: 2.0 - 3.0 standard drinks of alcohol    Types: 2 - 3 Cans of beer per week    Comment: occasional   Drug use: Not Currently   Sexual activity: Not on file  Other Topics Concern   Not on file  Social History Narrative   Not on file   Social Determinants of Health   Financial Resource Strain: Not on file  Food Insecurity: Not on file  Transportation Needs: Not on file  Physical Activity: Not on file  Stress: Not on file  Social Connections: Not on file     Family History: The patient's family history includes Colon cancer in his father; Heart disease in his father and mother. There is no history of Esophageal cancer, Stomach cancer, or Rectal cancer.  ROS:   Review of Systems  Constitutional: Negative.   HENT: Negative.    Eyes: Negative.   Respiratory:  Positive for shortness of breath. Negative for cough, hemoptysis, sputum production and wheezing.        See HPI.   Cardiovascular: Negative.   Gastrointestinal: Negative.   Genitourinary: Negative.   Musculoskeletal:  Positive for myalgias. Negative for back pain, falls, joint pain and neck pain.       Occasional upper left arm pain, from overworking arms.   Skin: Negative.   Neurological: Negative.   Endo/Heme/Allergies: Negative.   Psychiatric/Behavioral: Negative.      Please see the history of present illness.    All other systems reviewed and are negative.  EKGs/Labs/Other Studies Reviewed:    The following studies were reviewed today:   EKG:  EKG is not ordered today.    Cardiac telemetry monitor on  May 12, 2022: SR/SB/ST No arrhthymias detected.    2D echocardiogram on April 24, 2022: 1. Left ventricular ejection fraction, by estimation, is 55 to 60%. The  left ventricle has normal function. The left ventricle has no regional  wall motion abnormalities. There is mild left ventricular hypertrophy.  Left ventricular diastolic parameters  were normal.   2. Right ventricular systolic function is normal. The right ventricular  size is normal.   3. The mitral valve is normal in structure. Trivial mitral valve  regurgitation.  4. The aortic valve is tricuspid. Aortic valve regurgitation is not  visualized. Aortic valve sclerosis is present, with no evidence of aortic  valve stenosis.   5. The inferior vena cava is normal in size with greater than 50%  respiratory variability, suggesting right atrial pressure of 3 mmHg.  Recent Labs: 01/22/2022: BUN 17; Creatinine, Ser 1.14; Hemoglobin 15.3; Platelets 145; Potassium 3.7; Sodium 134 04/09/2022: ALT 20  Recent Lipid Panel    Component Value Date/Time   CHOL 162 04/09/2022 1012   TRIG 155 (H) 04/09/2022 1012   HDL 49 04/09/2022 1012   CHOLHDL 3.3 04/09/2022 1012   CHOLHDL 4.2 03/03/2016 1521   VLDL 36 (H) 03/03/2016 1521   LDLCALC 86 04/09/2022 1012     Risk Assessment/Calculations:    CHA2DS2-VASc Score = 3  This indicates a 3.2% annual risk of stroke. The patient's score is based upon: CHF History: 0 HTN History: 1 Diabetes History: 1 Stroke History: 0 Vascular Disease History: 0 Age Score: 1 Gender Score: 0   The 10-year ASCVD risk score (Arnett DK, et al., 2019) is: 33.1%   Values used to calculate the score:     Age: 66 years     Sex: Male     Is Non-Hispanic African American: Yes     Diabetic: Yes     Tobacco smoker: No     Systolic Blood Pressure: 147 mmHg     Is BP treated: Yes     HDL Cholesterol: 49 mg/dL     Total Cholesterol: 162 mg/dL  Physical Exam:    VS:  BP 137/77   Pulse 69   Ht 5'  11" (1.803 m)   Wt 291 lb 12.8 oz (132.4 kg)   SpO2 98%   BMI 40.70 kg/m     Wt Readings from Last 3 Encounters:  07/10/22 291 lb 12.8 oz (132.4 kg)  04/09/22 279 lb 12.8 oz (126.9 kg)  01/22/22 281 lb 15.5 oz (127.9 kg)    GEN: Obese, 66 y.o. African American male in NAD.  HEENT: Normal NECK: No JVD; No carotid bruits CARDIAC: S1/S2, RRR, no murmurs, rubs, gallops; 2+ peripheral pulses throughout, strong and equal bilaterally RESPIRATORY:  Clear and diminished to auscultation without rales, wheezing or rhonchi  MUSCULOSKELETAL:  No edema; No deformity  SKIN: Warm and dry NEUROLOGIC:  Alert and oriented x 3 PSYCHIATRIC:  Normal, pleasant affect   ASSESSMENT:    1. Paroxysmal atrial fibrillation (HCC)   2. Current use of long term anticoagulation   3. Essential hypertension   4. Dyslipidemia   5. Type 2 diabetes mellitus with complication, without long-term current use of insulin (HCC)   6. Class 3 severe obesity due to excess calories with body mass index (BMI) of 40.0 to 44.9 in adult, unspecified whether serious comorbidity present ([place])   7. Shortness of breath   8. Thrombocytopenia ([place])   9. Screening for cardiovascular condition    PLAN:    In order of problems listed above:  PAF, on long term anticoagulation Denies any tachycardia or palpitations. Has been out of Eliquis for the past 2 weeks. Denies any stroke like symptoms, tachycardia, low blood pressure readings, or signs or symptoms of PE or DVT. Will refill Eliquis and Toprol XL today and check CBC and D-dimer. If D-dimer is elevated, plan to arrange patient to go to [facility] ED to be evaluated for PE/DVT. Discussed importance of Eliquis compliance and risk associated if noncompliant with Eliquis including risk of stroke,  DVT, or PE.  He is on appropriate dosing of Eliquis.  Continue Eliquis 5 mg twice daily.  Continue Toprol-XL 25 mg daily. Heart healthy diet and regular cardiovascular exercise as tolerated  encouraged after DVT/PE has been ruled out.   HTN Blood pressure today, 137/77.  Continue losartan and Toprol XL.  SBP goal less than 130.  If SBP is not < 130 at next f/u, consider increasing losartan. Heart healthy diet and regular cardiovascular exercise encouraged once DVT/PE has been ruled out.   Dyslipidemia Labs from August 2023 revealed total cholesterol 162, HDL 49, LDL 86, triglycerides 155.  Continue Lipitor 40 mg daily. Heart healthy diet and regular cardiovascular exercise encouraged once DVT/PE has been ruled out.   T2DM Last hemoglobin A1c 12.8% in May 2023.  This is currently being managed by PCP.  Continue Jardiance, glipizide, and metformin. Continue to follow with PCP. Heart healthy/diabetic diet and regular cardiovascular exercise encouraged once DVT/PE has been ruled out.   Obesity BMI today 40.70.  Weight is up 12 pounds since August 2023. Weight loss via diet and exercise encouraged once DVT/PE has been ruled out. Discussed the impact being overweight would have on cardiovascular risk.  6. Shortness of breath Stable, chronic. Patient attributes this to weight.  TTE in August 2023 revealed EF 55 to 60%, denies any angina, therefore no indication for ischemic evaluation at this time.  We will obtain D-dimer as mentioned above to r/o PE, although highly unlikely as he denies any tachycardia, palpitations, low blood pressure readings, or sudden/severe or worsening shortness of breath.  7. Thrombocytopenia Labs from May 2023 revealed platelet count at 145,000.  Platelet count dropped 76,000 in 12/2021. Denies any issues of bleeding. Will recheck CBC today to evaluate for thrombocytopenia, along with D-dimer as mentioned above.   8. Screening for cardiovascular condition At last visit with Dr. Gwenlyn Found in August 2023, he ordered a coronary calcium score to guide how aggressive to be with lipid management.  It appears that he has not scheduled this, patient stated he did not want to  have testing at that time. Will reach out to patient either via MyChart message or via phone call to see if he is ready to schedule now. If not, consider re-addressing this at next follow-up.     9. Disposition: Follow-up with Dr. Gwenlyn Found in 3 to 4 months or sooner if anything changes.   Medication Adjustments/Labs and Tests Ordered: Current medicines are reviewed at length with the patient today.  Concerns regarding medicines are outlined above.  Orders Placed This Encounter  Procedures   CBC   D-Dimer, Quantitative   Meds ordered this encounter  Medications   DISCONTD: ezetimibe (ZETIA) 10 MG tablet    Sig: Take 1 tablet (10 mg total) by mouth daily.    Dispense:  30 tablet    Refill:  6   apixaban (ELIQUIS) 5 MG TABS tablet    Sig: Take 1 tablet (5 mg total) by mouth 2 (two) times daily.    Dispense:  60 tablet    Refill:  6   losartan (COZAAR) 25 MG tablet    Sig: Take 0.5 tablets (12.5 mg total) by mouth daily.    Dispense:  90 tablet    Refill:  1   metoprolol succinate (TOPROL-XL) 25 MG 24 hr tablet    Sig: Take 1 tablet (25 mg total) by mouth daily.    Dispense:  90 tablet    Refill:  1  Patient Instructions  Medication Instructions:  The current medical regimen is effective;  continue present plan and medications as directed. Please refer to the Current Medication list given to you today.  *If you need a refill on your cardiac medications before your next appointment, please call your pharmacy*   Lab Work: CBC AND D-DIMER TODAY If you have labs (blood work) drawn today and your tests are completely normal, you will receive your results only by: [place] (if you have MyChart) OR A paper copy in the mail  If you have any lab test that is abnormal or we need to change your treatment, we will call you to review the results.  Testing/Procedures: NONE  Follow-Up: At [facility], you and your health needs are our priority.  As part of our continuing  mission to provide you with exceptional heart care, we have created designated Provider Care Teams.  These Care Teams include your primary Cardiologist (physician) and Advanced Practice Providers (APPs -  Physician Assistants and Nurse Practitioners) who all work together to provide you with the care you need, when you need it.  Your next appointment:   3-4 month(s)  The format for your next appointment:   In Person  Provider:   Quay Burow, MD     Other Instructions PLEASE [place]  SALTY 6  PLEASE [place] Diet A [place] refers to food and lifestyle choices that are based on the traditions of countries located on the [employer]. It focuses on eating more fruits, vegetables, whole grains, beans, nuts, seeds, and heart-healthy fats, and eating less dairy, meat, eggs, and processed foods with added sugar, salt, and fat. This way of eating has been shown to help prevent certain conditions and improve outcomes for people who have chronic diseases, like kidney disease and heart disease. What are tips for following this plan? Reading food labels Check the serving size of packaged foods. For foods such as rice and pasta, the serving size refers to the amount of cooked product, not dry. Check the total fat in packaged foods. Avoid foods that have saturated fat or trans fats. Check the ingredient list for added sugars, such as corn syrup. Shopping  Buy a variety of foods that offer a balanced diet, including: Fresh fruits and vegetables (produce). Grains, beans, nuts, and seeds. Some of these may be available in unpackaged forms or large amounts (in bulk). Fresh seafood. Poultry and eggs. Low-fat dairy products. Buy whole ingredients instead of prepackaged foods. Buy fresh fruits and vegetables in-season from local farmers markets. Buy plain frozen fruits and  vegetables. If you do not have access to quality fresh seafood, buy precooked frozen shrimp or canned fish, such as tuna, salmon, or sardines. Stock your pantry so you always have certain foods on hand, such as olive oil, canned tuna, canned tomatoes, rice, pasta, and beans. Cooking Cook foods with extra-virgin olive oil instead of using butter or other vegetable oils. Have meat as a side dish, and have vegetables or grains as your main dish. This means having meat in small portions or adding small amounts of meat to foods like pasta or stew. Use beans or vegetables instead of meat in common dishes like chili or lasagna. Experiment with different cooking methods. Try roasting, broiling, steaming, and sauting vegetables. Add frozen vegetables to soups, stews, pasta, or rice. Add nuts or  seeds for added healthy fats and plant protein at each meal. You can add these to yogurt, salads, or vegetable dishes. Marinate fish or vegetables using olive oil, lemon juice, garlic, and fresh herbs. Meal planning Plan to eat one vegetarian meal one day each week. Try to work up to two vegetarian meals, if possible. Eat seafood two or more times a week. Have healthy snacks readily available, such as: Vegetable sticks with hummus. Greek yogurt. Fruit and nut trail mix. Eat balanced meals throughout the week. This includes: Fruit: 2-3 servings a day. Vegetables: 4-5 servings a day. Low-fat dairy: 2 servings a day. Fish, poultry, or lean meat: 1 serving a day. Beans and legumes: 2 or more servings a week. Nuts and seeds: 1-2 servings a day. Whole grains: 6-8 servings a day. Extra-virgin olive oil: 3-4 servings a day. Limit red meat and sweets to only a few servings a month. Lifestyle  Cook and eat meals together with your family, when possible. Drink enough fluid to keep your urine pale yellow. Be physically active every day. This includes: Aerobic exercise like running or swimming. Leisure activities  like gardening, walking, or housework. Get 7-8 hours of sleep each night. If recommended by your health care provider, drink red wine in moderation. This means 1 glass a day for nonpregnant women and 2 glasses a day for men. A glass of wine equals 5 oz (150 mL). What foods should I eat? Fruits Apples. Apricots. Avocado. Berries. Bananas. Cherries. Dates. Figs. Grapes. Lemons. Melon. Oranges. Peaches. Plums. Pomegranate. Vegetables Artichokes. Beets. Broccoli. Cabbage. Carrots. Eggplant. Green beans. Chard. Kale. Spinach. Onions. Leeks. Peas. Squash. Tomatoes. Peppers. Radishes. Grains Whole-grain pasta. Brown rice. Bulgur wheat. Polenta. Couscous. Whole-wheat bread. [place]. Meats and other proteins Beans. Almonds. Sunflower seeds. Pine nuts. Peanuts. [place]. Salmon. Scallops. Shrimp. [place]. Tilapia. Clams. Oysters. Eggs. Poultry without skin. Dairy Low-fat milk. Cheese. Greek yogurt. Fats and oils Extra-virgin olive oil. Avocado oil. Grapeseed oil. Beverages Water. Red wine. Herbal tea. Sweets and desserts Greek yogurt with honey. Baked apples. Poached pears. Trail mix. Seasonings and condiments Basil. Cilantro. Coriander. Cumin. Mint. Parsley. Sage. Rosemary. Tarragon. Garlic. Oregano. Thyme. Pepper. Balsamic vinegar. Tahini. Hummus. Tomato sauce. Olives. Mushrooms. The items listed above may not be a complete list of foods and beverages you can eat. Contact a dietitian for more information. What foods should I limit? This is a list of foods that should be eaten rarely or only on special occasions. Fruits Fruit canned in syrup. Vegetables Deep-fried potatoes (french fries). Grains Prepackaged pasta or rice dishes. Prepackaged cereal with added sugar. Prepackaged snacks with added sugar. Meats and other proteins Beef. Pork. Lamb. Poultry with skin. Hot dogs. [place] Salines. Dairy Ice cream. Sour cream. Whole milk. Fats and oils Butter. Canola oil. Vegetable oil. Beef fat (tallow).  Lard. Beverages Juice. Sugar-sweetened soft drinks. Beer. Liquor and spirits. Sweets and desserts Cookies. Cakes. Pies. Candy. Seasonings and condiments Mayonnaise. Pre-made sauces and marinades. The items listed above may not be a complete list of foods and beverages you should limit. Contact a dietitian for more information. Summary The Mediterranean diet includes both food and lifestyle choices. Eat a variety of fresh fruits and vegetables, beans, nuts, seeds, and whole grains. Limit the amount of red meat and sweets that you eat. If recommended by your health care provider, drink red wine in moderation. This means 1 glass a day for nonpregnant women and 2 glasses a day for men. A glass of wine equals 5 oz (150 mL). This  information is not intended to replace advice given to you by your health care provider. Make sure you discuss any questions you have with your health care provider. Document Revised: 09/16/2019 Document Reviewed: 07/14/2019 Elsevier Patient Education  [place], Finis Bud, NP  07/10/2022 1:13 PM    [place]

## 2022-07-10 ENCOUNTER — Ambulatory Visit: Payer: Medicare Other | Attending: Student | Admitting: Nurse Practitioner

## 2022-07-10 ENCOUNTER — Ambulatory Visit: Payer: Medicare Other | Admitting: Student

## 2022-07-10 ENCOUNTER — Encounter: Payer: Self-pay | Admitting: General Practice

## 2022-07-10 VITALS — BP 137/77 | HR 69 | Ht 71.0 in | Wt 291.8 lb

## 2022-07-10 DIAGNOSIS — I480 Paroxysmal atrial fibrillation: Secondary | ICD-10-CM | POA: Diagnosis not present

## 2022-07-10 DIAGNOSIS — Z7901 Long term (current) use of anticoagulants: Secondary | ICD-10-CM

## 2022-07-10 DIAGNOSIS — E785 Hyperlipidemia, unspecified: Secondary | ICD-10-CM

## 2022-07-10 DIAGNOSIS — Z136 Encounter for screening for cardiovascular disorders: Secondary | ICD-10-CM | POA: Diagnosis not present

## 2022-07-10 DIAGNOSIS — R0602 Shortness of breath: Secondary | ICD-10-CM | POA: Diagnosis not present

## 2022-07-10 DIAGNOSIS — D696 Thrombocytopenia, unspecified: Secondary | ICD-10-CM | POA: Diagnosis not present

## 2022-07-10 DIAGNOSIS — Z6841 Body Mass Index (BMI) 40.0 and over, adult: Secondary | ICD-10-CM

## 2022-07-10 DIAGNOSIS — I10 Essential (primary) hypertension: Secondary | ICD-10-CM | POA: Diagnosis not present

## 2022-07-10 DIAGNOSIS — E118 Type 2 diabetes mellitus with unspecified complications: Secondary | ICD-10-CM | POA: Diagnosis not present

## 2022-07-10 MED ORDER — EZETIMIBE 10 MG PO TABS: 10.0000 mg | ORAL_TABLET | Freq: Every day | ORAL | 6 refills | Status: DC

## 2022-07-10 MED ORDER — LOSARTAN POTASSIUM 25 MG PO TABS: 12.5000 mg | ORAL_TABLET | Freq: Every day | ORAL | 1 refills | Status: DC

## 2022-07-10 MED ORDER — APIXABAN 5 MG PO TABS: 5.0000 mg | ORAL_TABLET | Freq: Two times a day (BID) | ORAL | 6 refills | Status: DC

## 2022-07-10 MED ORDER — METOPROLOL SUCCINATE ER 25 MG PO TB24: 25.0000 mg | ORAL_TABLET | Freq: Every day | ORAL | 1 refills | Status: DC

## 2022-07-10 NOTE — Patient Instructions (Signed)
Medication Instructions:  The current medical regimen is effective;  continue present plan and medications as directed. Please refer to the Current Medication list given to you today.  *If you need a refill on your cardiac medications before your next appointment, please call your pharmacy*   Lab Work: CBC AND D-DIMER TODAY If you have labs (blood work) drawn today and your tests are completely normal, you will receive your results only by: [place] (if you have MyChart) OR A paper copy in the mail  If you have any lab test that is abnormal or we need to change your treatment, we will call you to review the results.  Testing/Procedures: NONE  Follow-Up: At [facility], you and your health needs are our priority.  As part of our continuing mission to provide you with exceptional heart care, we have created designated Provider Care Teams.  These Care Teams include your primary Cardiologist (physician) and Advanced Practice Providers (APPs -  Physician Assistants and Nurse Practitioners) who all work together to provide you with the care you need, when you need it.  Your next appointment:   3-4 month(s)  The format for your next appointment:   In Person  Provider:   Quay Burow, MD     Other Instructions PLEASE [place]  SALTY 6  PLEASE [place] Diet [place] refers to food and lifestyle choices that are based on the traditions of countries located on the [employer]. It focuses on eating more fruits, vegetables, whole grains, beans, nuts, seeds, and heart-healthy fats, and eating less dairy, meat, eggs, and processed foods with added sugar, salt, and fat. This way of eating has been shown to help prevent certain conditions and improve outcomes for people who have chronic diseases, like kidney disease and heart disease. What are tips for  following this plan? Reading food labels Check the serving size of packaged foods. For foods such as rice and pasta, the serving size refers to the amount of cooked product, not dry. Check the total fat in packaged foods. Avoid foods that have saturated fat or trans fats. Check the ingredient list for added sugars, such as corn syrup. Shopping  Buy a variety of foods that offer a balanced diet, including: Fresh fruits and vegetables (produce). Grains, beans, nuts, and seeds. Some of these may be available in unpackaged forms or large amounts (in bulk). Fresh seafood. Poultry and eggs. Low-fat dairy products. Buy whole ingredients instead of prepackaged foods. Buy fresh fruits and vegetables in-season from local farmers markets. Buy plain frozen fruits and vegetables. If you do not have access to quality fresh seafood, buy precooked frozen shrimp or canned fish, such as tuna, salmon, or sardines. Stock your pantry so you always have certain foods on hand, such as olive oil, canned tuna, canned tomatoes, rice, pasta, and beans. Cooking Cook foods with extra-virgin olive oil instead of using butter or other vegetable oils. Have meat as a side dish, and have vegetables or grains as your main dish. This means having meat in small portions or adding small amounts of meat to foods like pasta or stew. Use beans or vegetables instead of meat in common dishes like chili or lasagna. Experiment with different cooking methods. Try roasting, broiling, steaming, and sauting vegetables. Add frozen vegetables to soups, stews, pasta, or rice. Add nuts or seeds for added healthy  fats and plant protein at each meal. You can add these to yogurt, salads, or vegetable dishes. Marinate fish or vegetables using olive oil, lemon juice, garlic, and fresh herbs. Meal planning Plan to eat one vegetarian meal one day each week. Try to work up to two vegetarian meals, if possible. Eat seafood two or more times a  week. Have healthy snacks readily available, such as: Vegetable sticks with hummus. Greek yogurt. Fruit and nut trail mix. Eat balanced meals throughout the week. This includes: Fruit: 2-3 servings a day. Vegetables: 4-5 servings a day. Low-fat dairy: 2 servings a day. Fish, poultry, or lean meat: 1 serving a day. Beans and legumes: 2 or more servings a week. Nuts and seeds: 1-2 servings a day. Whole grains: 6-8 servings a day. Extra-virgin olive oil: 3-4 servings a day. Limit red meat and sweets to only a few servings a month. Lifestyle  Cook and eat meals together with your family, when possible. Drink enough fluid to keep your urine pale yellow. Be physically active every day. This includes: Aerobic exercise like running or swimming. Leisure activities like gardening, walking, or housework. Get 7-8 hours of sleep each night. If recommended by your health care provider, drink red wine in moderation. This means 1 glass a day for nonpregnant women and 2 glasses a day for men. A glass of wine equals 5 oz (150 mL). What foods should I eat? Fruits Apples. Apricots. Avocado. Berries. Bananas. Cherries. Dates. Figs. Grapes. Lemons. Melon. Oranges. Peaches. Plums. Pomegranate. Vegetables Artichokes. Beets. Broccoli. Cabbage. Carrots. Eggplant. Green beans. Chard. Kale. Spinach. Onions. Leeks. Peas. Squash. Tomatoes. Peppers. Radishes. Grains Whole-grain pasta. Brown rice. Bulgur wheat. Polenta. Couscous. Whole-wheat bread. [place]. Meats and other proteins Beans. Almonds. Sunflower seeds. Pine nuts. Peanuts. [place]. Salmon. Scallops. Shrimp. [place]. Tilapia. Clams. Oysters. Eggs. Poultry without skin. Dairy Low-fat milk. Cheese. Greek yogurt. Fats and oils Extra-virgin olive oil. Avocado oil. Grapeseed oil. Beverages Water. Red wine. Herbal tea. Sweets and desserts Greek yogurt with honey. Baked apples. Poached pears. Trail mix. Seasonings and condiments Basil. Cilantro.  Coriander. Cumin. Mint. Parsley. Sage. Rosemary. Tarragon. Garlic. Oregano. Thyme. Pepper. Balsamic vinegar. Tahini. Hummus. Tomato sauce. Olives. Mushrooms. The items listed above may not be a complete list of foods and beverages you can eat. Contact a dietitian for more information. What foods should I limit? This is a list of foods that should be eaten rarely or only on special occasions. Fruits Fruit canned in syrup. Vegetables Deep-fried potatoes (french fries). Grains Prepackaged pasta or rice dishes. Prepackaged cereal with added sugar. Prepackaged snacks with added sugar. Meats and other proteins Beef. Pork. Lamb. Poultry with skin. Hot dogs. [place] Salines. Dairy Ice cream. Sour cream. Whole milk. Fats and oils Butter. Canola oil. Vegetable oil. Beef fat (tallow). Lard. Beverages Juice. Sugar-sweetened soft drinks. Beer. Liquor and spirits. Sweets and desserts Cookies. Cakes. Pies. Candy. Seasonings and condiments Mayonnaise. Pre-made sauces and marinades. The items listed above may not be a complete list of foods and beverages you should limit. Contact a dietitian for more information. Summary The Mediterranean diet includes both food and lifestyle choices. Eat a variety of fresh fruits and vegetables, beans, nuts, seeds, and whole grains. Limit the amount of red meat and sweets that you eat. If recommended by your health care provider, drink red wine in moderation. This means 1 glass a day for nonpregnant women and 2 glasses a day for men. A glass of wine equals 5 oz (150 mL). This information is not intended  to replace advice given to you by your health care provider. Make sure you discuss any questions you have with your health care provider. Document Revised: 09/16/2019 Document Reviewed: 07/14/2019 Elsevier Patient Education  [place].

## 2022-07-11 LAB — D-DIMER, QUANTITATIVE: D-DIMER: 0.36 mg/L FEU (ref 0.00–0.49)

## 2022-07-11 LAB — CBC
Hematocrit: 42 % (ref 37.5–51.0)
Hemoglobin: 13.8 g/dL (ref 13.0–17.7)
MCH: 29.6 pg (ref 26.6–33.0)
MCHC: 32.9 g/dL (ref 31.5–35.7)
MCV: 90 fL (ref 79–97)
Platelets: 215 10*3/uL (ref 150–450)
RBC: 4.66 x10E6/uL (ref 4.14–5.80)
RDW: 13.7 % (ref 11.6–15.4)
WBC: 6.4 10*3/uL (ref 3.4–10.8)

## 2022-07-24 DIAGNOSIS — Z79899 Other long term (current) drug therapy: Secondary | ICD-10-CM | POA: Diagnosis not present

## 2022-07-24 DIAGNOSIS — E1169 Type 2 diabetes mellitus with other specified complication: Secondary | ICD-10-CM | POA: Diagnosis not present

## 2022-07-24 DIAGNOSIS — Z7984 Long term (current) use of oral hypoglycemic drugs: Secondary | ICD-10-CM | POA: Diagnosis not present

## 2022-07-24 DIAGNOSIS — Z0001 Encounter for general adult medical examination with abnormal findings: Secondary | ICD-10-CM | POA: Diagnosis not present

## 2022-07-24 DIAGNOSIS — D6869 Other thrombophilia: Secondary | ICD-10-CM | POA: Diagnosis not present

## 2022-07-24 DIAGNOSIS — I119 Hypertensive heart disease without heart failure: Secondary | ICD-10-CM | POA: Diagnosis not present

## 2022-07-24 DIAGNOSIS — E559 Vitamin D deficiency, unspecified: Secondary | ICD-10-CM | POA: Diagnosis not present

## 2022-07-24 DIAGNOSIS — M1990 Unspecified osteoarthritis, unspecified site: Secondary | ICD-10-CM | POA: Diagnosis not present

## 2022-07-24 DIAGNOSIS — I700 Atherosclerosis of aorta: Secondary | ICD-10-CM | POA: Diagnosis not present

## 2022-07-24 DIAGNOSIS — Z2820 Immunization not carried out because of patient decision for unspecified reason: Secondary | ICD-10-CM | POA: Diagnosis not present

## 2022-10-10 ENCOUNTER — Ambulatory Visit: Payer: Medicare HMO | Attending: Cardiovascular Disease | Admitting: Cardiovascular Disease

## 2022-10-10 ENCOUNTER — Encounter: Payer: Self-pay | Admitting: Cardiovascular Disease

## 2022-10-10 VITALS — BP 121/77 | HR 62 | Ht 71.0 in | Wt 290.8 lb

## 2022-10-10 DIAGNOSIS — E785 Hyperlipidemia, unspecified: Secondary | ICD-10-CM | POA: Diagnosis not present

## 2022-10-10 DIAGNOSIS — I10 Essential (primary) hypertension: Secondary | ICD-10-CM

## 2022-10-10 DIAGNOSIS — I480 Paroxysmal atrial fibrillation: Secondary | ICD-10-CM | POA: Diagnosis not present

## 2022-10-10 DIAGNOSIS — R6889 Other general symptoms and signs: Secondary | ICD-10-CM | POA: Diagnosis not present

## 2022-10-10 NOTE — Assessment & Plan Note (Signed)
History of dyslipidemia on statin therapy lipid profile performed 04/09/2022 revealing total cholesterol 162, LDL 86 and HDL 49.  He is at goal for primary prevention.  I am going to order a coronary calcium score given his risk factors and family history further stratify.

## 2022-10-10 NOTE — Progress Notes (Signed)
10/10/2022 Justin [name]   1955-11-23  [id number]  Primary Physician Ladell Pier, MD Primary Cardiologist: Lorretta Harp MD Lupe Carney, [place]  HPI:  Justin [name] is a 67 y.o.  morbidly overweight separated African-American male father of 2 daughters, grandfather of 2 grandsons referred by Cletis [place], NP for cardiovascular evaluation because of an episode of A-fib.  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  He now works as a Dispensing optician.  His cardiac risk factors include discontinue tobacco abuse, treated hypertension, diabetes and hyperlipidemia.  His father died of a myocardial infarction in his 59s and his brother died of myocardial infarction at age 51.  He has never had a heart attack or stroke.  He denies chest pain or shortness of breath.  He does walk without limitation.  He was recently seen in the ER on 01/22/2022 with A-fib and was placed on Eliquis.  He is unaware of whether he has been in A-fib in the past.  He denies symptoms of obstructive sleep apnea.  Since I saw him 6 months ago he continues to do well.  He is working to try to get guns off the street doing [employer] community events.  He denies chest pain or shortness of breath.  He does complain of some pain and tingling in his left arm but this does not sound anginal.  In reviewing his laboratories hemoglobin A1c is above 12 and I have told him to return to his PCP to discuss more aggressive diabetes management.   Current Meds  Medication Sig   ACCU-CHEK GUIDE test strip USE AS DIRECTED TO CHECK BLOOD SUGAR DAILY   Accu-Chek Softclix Lancets lancets daily.   apixaban (ELIQUIS) 5 MG TABS tablet Take 1 tablet (5 mg total) by mouth 2 (two) times daily.   atorvastatin (LIPITOR) 40 MG tablet Take 40 mg by mouth daily.   Blood Glucose Monitoring Suppl (ACCU-CHEK GUIDE) w/Device KIT CHECK BLOOD SUGAR ONE TIME A DAY   glipiZIDE (GLUCOTROL) 10 MG tablet Take 1 tablet (10 mg total) by  mouth daily before breakfast.   JARDIANCE 10 MG TABS tablet Take 10 mg by mouth daily.   losartan (COZAAR) 25 MG tablet Take 0.5 tablets (12.5 mg total) by mouth daily.   meloxicam (MOBIC) 15 MG tablet TAKE 1 TABLET(15 MG) BY MOUTH DAILY   metFORMIN (GLUCOPHAGE) 500 MG tablet Take 1 tablet (500 mg total) by mouth daily with breakfast.   metoprolol succinate (TOPROL-XL) 25 MG 24 hr tablet Take 1 tablet (25 mg total) by mouth daily.   tamsulosin (FLOMAX) 0.4 MG CAPS capsule Take 1 capsule (0.4 mg total) by mouth daily.     No Known Allergies  Social History   Socioeconomic History   Marital status: Legally Separated    Spouse name: Not on file   Number of children: Not on file   Years of education: Not on file   Highest education level: Not on file  Occupational History   Not on file  Tobacco Use   Smoking status: Former    Packs/day: 0.25    Types: Cigarettes   Smokeless tobacco: Never  Vaping Use   Vaping Use: Never used  Substance and Sexual Activity   Alcohol use: Yes    Alcohol/week: 2.0 - 3.0 standard drinks of alcohol    Types: 2 - 3 Cans of beer per week    Comment: occasional  Drug use: Not Currently   Sexual activity: Not on file  Other Topics Concern   Not on file  Social History Narrative   Not on file   Social Determinants of Health   Financial Resource Strain: Not on file  Food Insecurity: Not on file  Transportation Needs: Not on file  Physical Activity: Not on file  Stress: Not on file  Social Connections: Not on file  Intimate Partner Violence: Not on file     Review of Systems: General: negative for chills, fever, night sweats or weight changes.  Cardiovascular: negative for chest pain, dyspnea on exertion, edema, orthopnea, palpitations, paroxysmal nocturnal dyspnea or shortness of breath Dermatological: negative for rash Respiratory: negative for cough or wheezing Urologic: negative for hematuria Abdominal: negative for nausea, vomiting,  diarrhea, bright red blood per rectum, melena, or hematemesis Neurologic: negative for visual changes, syncope, or dizziness All other systems reviewed and are otherwise negative except as noted above.    Blood pressure 121/77, pulse 62, height 5' 11"$  (1.803 m), weight 290 lb 12.8 oz (131.9 kg), SpO2 97 %.  General appearance: alert and no distress Neck: no adenopathy, no carotid bruit, no JVD, supple, symmetrical, trachea midline, and thyroid not enlarged, symmetric, no tenderness/mass/nodules Lungs: clear to auscultation bilaterally Heart: regular rate and rhythm, S1, S2 normal, no murmur, click, rub or gallop Extremities: extremities normal, atraumatic, no cyanosis or edema Pulses: 2+ and symmetric Skin: Skin color, texture, turgor normal. No rashes or lesions Neurologic: Grossly normal  EKG sinus rhythm at 68 with nonspecific ST and T wave changes.  I personally reviewed this EKG.  ASSESSMENT AND PLAN:   Dyslipidemia History of dyslipidemia on statin therapy lipid profile performed 04/09/2022 revealing total cholesterol 162, LDL 86 and HDL 49.  He is at goal for primary prevention.  I am going to order a coronary calcium score given his risk factors and family history further stratify.  Tobacco abuse Discontinue tobacco abuse 4 years ago.  Essential hypertension History of essential hypertension blood pressure measured today 121/77.  He is on metoprolol.  Paroxysmal atrial fibrillation (HCC) History of PAF maintaining sinus rhythm on Eliquis oral anticoagulation.     Lorretta Harp MD FACP,FACC,FAHA, [facility] 10/10/2022 8:20 AM

## 2022-10-10 NOTE — Addendum Note (Signed)
Addended by: Beatrix Fetters on: 10/10/2022 08:29 AM   Modules accepted: Orders

## 2022-10-10 NOTE — Assessment & Plan Note (Signed)
Discontinue tobacco abuse 4 years ago.

## 2022-10-10 NOTE — Assessment & Plan Note (Signed)
History of essential hypertension blood pressure measured today 121/77.  He is on metoprolol.

## 2022-10-10 NOTE — Assessment & Plan Note (Signed)
History of PAF maintaining sinus rhythm on Eliquis oral anticoagulation. 

## 2022-10-10 NOTE — Patient Instructions (Signed)
Medication Instructions:  Your physician recommends that you continue on your current medications as directed. Please refer to the Current Medication list given to you today.  *If you need a refill on your cardiac medications before your next appointment, please call your pharmacy*   Testing/Procedures: Dr. Gwenlyn Found has ordered a CT coronary calcium score.   Test locations:  [place]   This is $99 out of pocket.   Coronary CalciumScan A coronary calcium scan is an imaging test used to look for deposits of calcium and other fatty materials (plaques) in the inner lining of the blood vessels of the heart (coronary arteries). These deposits of calcium and plaques can partly clog and narrow the coronary arteries without producing any symptoms or warning signs. This puts a person at risk for a heart attack. This test can detect these deposits before symptoms develop. Tell a health care provider about: Any allergies you have. All medicines you are taking, including vitamins, herbs, eye drops, creams, and over-the-counter medicines. Any problems you or family members have had with anesthetic medicines. Any blood disorders you have. Any surgeries you have had. Any medical conditions you have. Whether you are pregnant or may be pregnant. What are the risks? Generally, this is a safe procedure. However, problems may occur, including: Harm to a pregnant woman and her unborn baby. This test involves the use of radiation. Radiation exposure can be dangerous to a pregnant woman and her unborn baby. If you are pregnant, you generally should not have this procedure done. Slight increase in the risk of cancer. This is because of the radiation involved in the test. What happens before the procedure? No preparation is needed for this procedure. What happens during the procedure? You will undress and remove any jewelry around your neck or chest. You will put on a hospital  gown. Sticky electrodes will be placed on your chest. The electrodes will be connected to an electrocardiogram (ECG) machine to record a tracing of the electrical activity of your heart. A CT scanner will take pictures of your heart. During this time, you will be asked to lie still and hold your breath for 2-3 seconds while a picture of your heart is being taken. The procedure may vary among health care providers and hospitals. What happens after the procedure? You can get dressed. You can return to your normal activities. It is up to you to get the results of your test. Ask your health care provider, or the department that is doing the test, when your results will be ready. Summary A coronary calcium scan is an imaging test used to look for deposits of calcium and other fatty materials (plaques) in the inner lining of the blood vessels of the heart (coronary arteries). Generally, this is a safe procedure. Tell your health care provider if you are pregnant or may be pregnant. No preparation is needed for this procedure. A CT scanner will take pictures of your heart. You can return to your normal activities after the scan is done. This information is not intended to replace advice given to you by your health care provider. Make sure you discuss any questions you have with your health care provider. Document Released: 02/07/2008 Document Revised: 06/30/2016 Document Reviewed: 06/30/2016 Elsevier Interactive Patient Education  2017 [place]: At [facility], you and your health needs are our priority.  As part of our continuing mission to provide you with exceptional heart care, we have created  designated Provider Care Teams.  These Care Teams include your primary Cardiologist (physician) and Advanced Practice Providers (APPs -  Physician Assistants and Nurse Practitioners) who all work together to provide you with the care you need, when you need it.  We recommend  signing up for the patient portal called "MyChart".  Sign up information is provided on this After Visit Summary.  MyChart is used to connect with patients for Virtual Visits (Telemedicine).  Patients are able to view lab/test results, encounter notes, upcoming appointments, etc.  Non-urgent messages can be sent to your provider as well.   To learn more about what you can do with MyChart, go to [email or web address].    Your next appointment:   6 month(s)  Provider:   Fabian Sharp, PA-C, Sande Rives, PA-C, Caron Presume, PA-C, Jory Sims, DNP, ANP, Almyra Deforest, PA-C, or Diona Browner, NP      Then, Quay Burow, MD will plan to see you again in 12 month(s).

## 2022-11-14 ENCOUNTER — Other Ambulatory Visit (HOSPITAL_BASED_OUTPATIENT_CLINIC_OR_DEPARTMENT_OTHER): Payer: Medicare HMO

## 2022-11-26 ENCOUNTER — Telehealth: Payer: Self-pay | Admitting: Pharmacist

## 2022-11-26 NOTE — Telephone Encounter (Signed)
Called patient to schedule a population health appointment. I was unable to reach the patient so I left a HIPAA-compliant message requesting that the patient return my call. He is also due for a PCP visit with his last visit occurring 07/13/20 via telelmedicine. We need to see if he still plans to continue seeing [place] for his care. Routing to scheduling to see if they are able to reach him for an appointment.    Benard Halsted, PharmD, [place], [place] [phone]

## 2022-12-24 LAB — HM DIABETES EYE EXAM

## 2023-01-09 ENCOUNTER — Encounter: Payer: Medicare PPO | Admitting: Internal Medicine

## 2023-01-09 ENCOUNTER — Telehealth: Payer: Self-pay | Admitting: Internal Medicine

## 2023-01-09 NOTE — Telephone Encounter (Signed)
Patient came in for his appt he resch to Jul-8- 2024 @ 2:50pm he has some concerns about his meds would like for CMA or Dr. Laural Benes contact him.

## 2023-03-02 ENCOUNTER — Encounter: Payer: Self-pay | Admitting: Internal Medicine

## 2023-03-02 ENCOUNTER — Other Ambulatory Visit: Payer: Self-pay

## 2023-03-02 ENCOUNTER — Ambulatory Visit: Payer: Medicare PPO | Attending: Internal Medicine | Admitting: Internal Medicine

## 2023-03-02 ENCOUNTER — Other Ambulatory Visit: Payer: Self-pay | Admitting: Pharmacist

## 2023-03-02 VITALS — BP 105/70 | HR 67 | Temp 98.3°F | Ht 71.0 in | Wt 277.0 lb

## 2023-03-02 DIAGNOSIS — Z7984 Long term (current) use of oral hypoglycemic drugs: Secondary | ICD-10-CM

## 2023-03-02 DIAGNOSIS — Z0000 Encounter for general adult medical examination without abnormal findings: Secondary | ICD-10-CM

## 2023-03-02 DIAGNOSIS — Z1331 Encounter for screening for depression: Secondary | ICD-10-CM

## 2023-03-02 DIAGNOSIS — E1159 Type 2 diabetes mellitus with other circulatory complications: Secondary | ICD-10-CM | POA: Diagnosis not present

## 2023-03-02 DIAGNOSIS — Z1211 Encounter for screening for malignant neoplasm of colon: Secondary | ICD-10-CM

## 2023-03-02 DIAGNOSIS — L602 Onychogryphosis: Secondary | ICD-10-CM | POA: Diagnosis not present

## 2023-03-02 DIAGNOSIS — Z0001 Encounter for general adult medical examination with abnormal findings: Secondary | ICD-10-CM

## 2023-03-02 DIAGNOSIS — Z125 Encounter for screening for malignant neoplasm of prostate: Secondary | ICD-10-CM

## 2023-03-02 DIAGNOSIS — E669 Obesity, unspecified: Secondary | ICD-10-CM

## 2023-03-02 DIAGNOSIS — E119 Type 2 diabetes mellitus without complications: Secondary | ICD-10-CM

## 2023-03-02 DIAGNOSIS — E785 Hyperlipidemia, unspecified: Secondary | ICD-10-CM

## 2023-03-02 DIAGNOSIS — I480 Paroxysmal atrial fibrillation: Secondary | ICD-10-CM

## 2023-03-02 DIAGNOSIS — E1169 Type 2 diabetes mellitus with other specified complication: Secondary | ICD-10-CM

## 2023-03-02 DIAGNOSIS — I152 Hypertension secondary to endocrine disorders: Secondary | ICD-10-CM

## 2023-03-02 LAB — GLUCOSE, POCT (MANUAL RESULT ENTRY): POC Glucose: 132 mg/dl — AB (ref 70–99)

## 2023-03-02 LAB — POCT GLYCOSYLATED HEMOGLOBIN (HGB A1C): HbA1c, POC (controlled diabetic range): 6.2 % (ref 0.0–7.0)

## 2023-03-02 MED ORDER — FREESTYLE LIBRE SENSOR SYSTEM MISC
12 refills | Status: AC
Start: 2023-03-02 — End: ?
  Filled 2023-03-02: qty 2, 28d supply, fill #0

## 2023-03-02 MED ORDER — ACCU-CHEK GUIDE VI STRP
ORAL_STRIP | 2 refills | Status: AC
Start: 2023-03-02 — End: ?
  Filled 2023-03-02: qty 100, 90d supply, fill #0

## 2023-03-02 MED ORDER — ACCU-CHEK SOFTCLIX LANCETS MISC
2 refills | Status: AC
Start: 2023-03-02 — End: ?
  Filled 2023-03-02: qty 100, 90d supply, fill #0

## 2023-03-02 MED ORDER — ACCU-CHEK GUIDE W/DEVICE KIT
PACK | 0 refills | Status: AC
Start: 2023-03-02 — End: ?
  Filled 2023-03-02: qty 1, 30d supply, fill #0

## 2023-03-02 MED ORDER — FREESTYLE LIBRE READER DEVI
0 refills | Status: AC
Start: 2023-03-02 — End: ?
  Filled 2023-03-02: qty 1, 30d supply, fill #0

## 2023-03-02 NOTE — Patient Instructions (Addendum)
Please bring all of your medication bottles with you on your next visit with me so that we can sort out what you should be on.  For the diabetes, you should continue the Jardiance but stopped the glipizide.  I have sent the prescription to our pharmacy for the continuous glucose monitor.  Keep up the good works in terms of trying to eat healthy and walking regularly.  Healthy Eating, Adult Healthy eating may help you get and keep a healthy body weight, reduce the risk of chronic disease, and live a long and productive life. It is important to follow a healthy eating pattern. Your nutritional and calorie needs should be met mainly by different nutrient-rich foods. What are tips for following this plan? Reading food labels Read labels and choose the following: Reduced or low sodium products. Juices with 100% fruit juice. Foods with low saturated fats (<3 g per serving) and high polyunsaturated and monounsaturated fats. Foods with whole grains, such as whole wheat, cracked wheat, brown rice, and wild rice. Whole grains that are fortified with folic acid. This is recommended for females who are pregnant or who want to become pregnant. Read labels and do not eat or drink the following: Foods or drinks with added sugars. These include foods that contain brown sugar, corn sweetener, corn syrup, dextrose, fructose, glucose, high-fructose corn syrup, honey, invert sugar, lactose, malt syrup, maltose, molasses, raw sugar, sucrose, trehalose, or turbinado sugar. Limit your intake of added sugars to less than 10% of your total daily calories. Do not eat more than the following amounts of added sugar per day: 6 teaspoons (25 g) for females. 9 teaspoons (38 g) for males. Foods that contain processed or refined starches and grains. Refined grain products, such as white flour, degermed cornmeal, white bread, and white rice. Shopping Choose nutrient-rich snacks, such as vegetables, whole fruits, and nuts.  Avoid high-calorie and high-sugar snacks, such as potato chips, fruit snacks, and candy. Use oil-based dressings and spreads on foods instead of solid fats such as butter, margarine, sour cream, or cream cheese. Limit pre-made sauces, mixes, and "instant" products such as flavored rice, instant noodles, and ready-made pasta. Try more plant-protein sources, such as tofu, tempeh, black beans, edamame, lentils, nuts, and seeds. Explore eating plans such as the Mediterranean diet or vegetarian diet. Try heart-healthy dips made with beans and healthy fats like hummus and guacamole. Vegetables go great with these. Cooking Use oil to saut or stir-fry foods instead of solid fats such as butter, margarine, or lard. Try baking, boiling, grilling, or broiling instead of frying. Remove the fatty part of meats before cooking. Steam vegetables in water or broth. Meal planning  At meals, imagine dividing your plate into fourths: One-half of your plate is fruits and vegetables. One-fourth of your plate is whole grains. One-fourth of your plate is protein, especially lean meats, poultry, eggs, tofu, beans, or nuts. Include low-fat dairy as part of your daily diet. Lifestyle Choose healthy options in all settings, including home, work, school, restaurants, or stores. Prepare your food safely: Wash your hands after handling raw meats. Where you prepare food, keep surfaces clean by regularly washing with hot, soapy water. Keep raw meats separate from ready-to-eat foods, such as fruits and vegetables. Cook seafood, meat, poultry, and eggs to the recommended temperature. Get a food thermometer. Store foods at safe temperatures. In general: Keep cold foods at 68F (4.4C) or below. Keep hot foods at 168F (60C) or above. Keep your freezer at 34F (-17.8C) or  below. Foods are not safe to eat if they have been between the temperatures of 40-140F (4.4-60C) for more than 2 hours. What foods should I  eat? Fruits Aim to eat 1-2 cups of fresh, canned (in natural juice), or frozen fruits each day. One cup of fruit equals 1 small apple, 1 large banana, 8 large strawberries, 1 cup (237 g) canned fruit,  cup (82 g) dried fruit, or 1 cup (240 mL) 100% juice. Vegetables Aim to eat 2-4 cups of fresh and frozen vegetables each day, including different varieties and colors. One cup of vegetables equals 1 cup (91 g) broccoli or cauliflower florets, 2 medium carrots, 2 cups (150 g) raw, leafy greens, 1 large tomato, 1 large bell pepper, 1 large sweet potato, or 1 medium white potato. Grains Aim to eat 5-10 ounce-equivalents of whole grains each day. Examples of 1 ounce-equivalent of grains include 1 slice of bread, 1 cup (40 g) ready-to-eat cereal, 3 cups (24 g) popcorn, or  cup (93 g) cooked rice. Meats and other proteins Try to eat 5-7 ounce-equivalents of protein each day. Examples of 1 ounce-equivalent of protein include 1 egg,  oz nuts (12 almonds, 24 pistachios, or 7 walnut halves), 1/4 cup (90 g) cooked beans, 6 tablespoons (90 g) hummus or 1 tablespoon (16 g) peanut butter. A cut of meat or fish that is the size of a deck of cards is about 3-4 ounce-equivalents (85 g). Of the protein you eat each week, try to have at least 8 sounce (227 g) of seafood. This is about 2 servings per week. This includes salmon, trout, herring, sardines, and anchovies. Dairy Aim to eat 3 cup-equivalents of fat-free or low-fat dairy each day. Examples of 1 cup-equivalent of dairy include 1 cup (240 mL) milk, 8 ounces (250 g) yogurt, 1 ounces (44 g) natural cheese, or 1 cup (240 mL) fortified soy milk. Fats and oils Aim for about 5 teaspoons (21 g) of fats and oils per day. Choose monounsaturated fats, such as canola and olive oils, mayonnaise made with olive oil or avocado oil, avocados, peanut butter, and most nuts, or polyunsaturated fats, such as sunflower, corn, and soybean oils, walnuts, pine nuts, sesame seeds,  sunflower seeds, and flaxseed. Beverages Aim for 6 eight-ounce glasses of water per day. Limit coffee to 3-5 eight-ounce cups per day. Limit caffeinated beverages that have added calories, such as soda and energy drinks. If you drink alcohol: Limit how much you have to: 0-1 drink a day if you are male. 0-2 drinks a day if you are male. Know how much alcohol is in your drink. In the [place] one drink is one 12 oz bottle of beer (355 mL), one 5 oz glass of wine (148 mL), or one 1 oz glass of hard liquor (44 mL). Seasoning and other foods Try not to add too much salt to your food. Try using herbs and spices instead of salt. Try not to add sugar to food. This information is based on [place] nutrition guidelines. To learn more, visit DisposableNylon.be. Exact amounts may vary. You may need different amounts. This information is not intended to replace advice given to you by your health care provider. Make sure you discuss any questions you have with your health care provider. Document Revised: 05/12/2022 Document Reviewed: 05/12/2022 Elsevier Patient Education  2024 [employer].

## 2023-03-02 NOTE — Progress Notes (Signed)
Patient ID: Justin [name], male    DOB: 07/05/1956  MRN: [id number]  CC: Annual Exam (Physical. Med refills. /Requesting continuous glucose monitor Justin [name] bug bite on scalp x1 mo)   Subjective: Justin [name] is a 67 y.o. male who presents for new pt visit to get re-est and annual exam His concerns today include:  Pt with hx of HTN, HL, tob dep, DM, obesity, primary OA knees, chronic LBP with surgery 2005, PAF   Last seen 3 yrs ago.  He was being followed at [facility].  Decided to change because his PCP was hardly there when he went to be seen.   He brings his bi-monthly pill box with him but not his bottles.  He is not sure of the names of all the medicines that he is taking or the dosage.  The only one that he remembers for sure is Jardiance.  He brings a medication list with him from [facility] from 1 year ago but he is not sure whether he is still on everything on the list.  DM: Results for orders placed or performed in visit on 03/02/23  POCT glucose (manual entry)  Result Value Ref Range   POC Glucose 132 (A) 70 - 99 mg/dl  POCT glycosylated hemoglobin (Hb A1C)  Result Value Ref Range   Hemoglobin A1C     HbA1c POC (<> result, manual entry)     HbA1c, POC (prediabetic range)     HbA1c, POC (controlled diabetic range) 6.2 0.0 - 7.0 %  Should be on jardiance 10 mg and Glipizide? 10 mg,  Stopped taking all of his meds x 3 wks ago after he was bitten on his scalp by a bug.  Developed what he sounds like small abscess that busted on its own.  Went down but reports he still has a little bump there.  He was not sure whether he should have continue taking his medication with an infection on his scalp.. -reports he does well with eating habits. Reports he has loss wgh, was over 300 lbs now at 277.  Does a lot of walking  PAF: Should be on Eliquis but stopped taking this 3 weeks ago as well.  HL:  on Lipitor but not sure of dose.  List from [facility] from 01/2022 has 10  mg but 40 mg on med list in the system  HTN:  no CP/SOB.  Little LE edema at times  Former smoker:  stopped 4 yrs ago  HM:  was suppose to have c-scope before being placed on Eliquis.  He is not sure what vaccines he received at [facility].  He tells me he does not remember receiving any.  Patient Active Problem List   Diagnosis Date Noted   Paroxysmal atrial fibrillation (HCC) 04/09/2022   Pain due to onychomycosis of toenails of both feet 09/14/2020   Controlled type 2 diabetes mellitus without complication, with long-term current use of insulin (HCC) 03/11/2020   Tetanus, diphtheria, and acellular pertussis (Tdap) vaccination declined 03/08/2020   Pneumococcal vaccination declined 03/08/2020   Primary osteoarthritis of both knees 12/21/2017   Mixed hyperlipidemia 02/23/2017   Essential hypertension 12/27/2016   Tobacco abuse 03/03/2016   Lumbosacral spondylosis without myelopathy 03/08/2014   Lumbar post-laminectomy syndrome 03/08/2014   Dyslipidemia 06/06/2013   Obesity (BMI 30-39.9) 06/06/2013   Low back pain 11/17/2012   Lumbago 11/17/2012     Current Outpatient Medications on File Prior to Visit  Medication Sig  Dispense Refill   apixaban (ELIQUIS) 5 MG TABS tablet Take 1 tablet (5 mg total) by mouth 2 (two) times daily. 60 tablet 6   atorvastatin (LIPITOR) 40 MG tablet Take 40 mg by mouth daily. (Patient not taking: Reported on 03/02/2023)     glipiZIDE (GLUCOTROL) 10 MG tablet Take 1 tablet (10 mg total) by mouth daily before breakfast. (Patient not taking: Reported on 03/02/2023) 30 tablet 0   JARDIANCE 10 MG TABS tablet Take 10 mg by mouth daily. (Patient not taking: Reported on 03/02/2023)     losartan (COZAAR) 25 MG tablet Take 0.5 tablets (12.5 mg total) by mouth daily. (Patient not taking: Reported on 03/02/2023) 90 tablet 1   meloxicam (MOBIC) 15 MG tablet TAKE 1 TABLET(15 MG) BY MOUTH DAILY (Patient not taking: Reported on 03/02/2023) 30 tablet 0   metoprolol succinate  (TOPROL-XL) 25 MG 24 hr tablet Take 1 tablet (25 mg total) by mouth daily. (Patient not taking: Reported on 03/02/2023) 90 tablet 1   tamsulosin (FLOMAX) 0.4 MG CAPS capsule Take 1 capsule (0.4 mg total) by mouth daily. (Patient not taking: Reported on 03/02/2023) 30 capsule 3   No current facility-administered medications on file prior to visit.    No Known Allergies  Social History   Socioeconomic History   Marital status: Legally Separated    Spouse name: Not on file   Number of children: Not on file   Years of education: Not on file   Highest education level: Not on file  Occupational History   Not on file  Tobacco Use   Smoking status: Former    Packs/day: .25    Types: Cigarettes   Smokeless tobacco: Never  Vaping Use   Vaping Use: Never used  Substance and Sexual Activity   Alcohol use: Yes    Alcohol/week: 2.0 - 3.0 standard drinks of alcohol    Types: 2 - 3 Cans of beer per week    Comment: occasional   Drug use: Not Currently   Sexual activity: Not on file  Other Topics Concern   Not on file  Social History Narrative   Not on file   Social Determinants of Health   Financial Resource Strain: Not on file  Food Insecurity: Not on file  Transportation Needs: Not on file  Physical Activity: Not on file  Stress: Not on file  Social Connections: Not on file  Intimate Partner Violence: Not on file    Family History  Problem Relation Age of Onset   Heart disease Mother    Colon cancer Father    Heart disease Father    Esophageal cancer Neg Hx    Stomach cancer Neg Hx    Rectal cancer Neg Hx     Past Surgical History:  Procedure Laterality Date   BACK SURGERY  06/24/2004   [facility]-    MULTIPLE EXTRACTIONS WITH ALVEOLOPLASTY N/A 07/03/2017   Procedure: MULTIPLE EXTRACTION WITH ALVEOLOPLASTY;  Surgeon: Ocie Doyne, DDS;  Location: [facility] OR;  Service: Oral Surgery;  Laterality: N/A;   TONSILLECTOMY     UMBILICAL HERNIA REPAIR      ROS: Review of Systems   Constitutional:  Negative for appetite change.  HENT:  Negative for congestion, hearing loss and trouble swallowing.        Wears dentures above and below but currently not able to wear them because they need to be refitted. Gets ringing in the ears intermittently.  Eyes:        Had eye exam  several weeks ago at [facility].  Prescribed new glasses.  States that he has not turned in the prescription as yet.  Respiratory:  Negative for chest tightness and shortness of breath.   Cardiovascular:  Negative for chest pain and palpitations.  Gastrointestinal:  Negative for abdominal pain and blood in stool.  Genitourinary:  Negative for difficulty urinating and hematuria.  Psychiatric/Behavioral:         Reports mild depression recently which he attributes to the recent 1 year anniversary of his mother's and brothers passing.  Denies any suicidal ideation.  Does not feel that he needs to be on any medication.   Negative except as stated above  PHYSICAL EXAM: BP 105/70 (BP Location: Left Arm, Patient Position: Sitting, Cuff Size: Large)   Pulse 67   Temp 98.3 F (36.8 C) (Oral)   Ht 5\' 11"  (1.803 m)   Wt 277 lb (125.6 kg)   SpO2 99%   BMI 38.63 kg/m   Physical Exam   General appearance - alert, well appearing, older African-American male and in no distress Mental status -patient is very talkative and forgetful Eyes - pupils equal and reactive, extraocular eye movements intact Ears - bilateral TM's and external ear canals normal Nose - normal and patent, no erythema, discharge or polyps Mouth -he is edentulous.  Throat is clear.  No oral lesions. Neck - supple, no significant adenopathy Lymphatics - no palpable lymphadenopathy, no hepatosplenomegaly Chest - clear to auscultation, no wheezes, rales or rhonchi, symmetric air entry Heart - normal rate, regular rhythm, normal S1, S2, no murmurs, rubs, clicks or gallops Abdomen - soft, nontender, nondistended, no masses or  organomegaly Musculoskeletal - no joint tenderness, deformity or swelling Extremities -no lower extremity edema. Skin: Small scar noted on the top of the scalp from where small abscess was previously. Diabetic Foot Exam - Simple   Simple Foot Form Diabetic Foot exam was performed with the following findings: Yes 03/02/2023  6:19 PM  Visual Inspection See comments: Yes Sensation Testing Intact to touch and monofilament testing bilaterally: Yes Pulse Check Posterior Tibialis and Dorsalis pulse intact bilaterally: Yes Comments Toenails are thick, discolored and overgrown.         Latest Ref Rng & Units 04/09/2022   10:12 AM 01/22/2022    6:48 PM 01/22/2022    5:48 PM  CMP  Glucose 70 - 99 mg/dL   161   BUN 8 - 23 mg/dL   17   Creatinine 0.96 - 1.24 mg/dL   0.45   Sodium 409 - 811 mmol/L  134  134   Potassium 3.5 - 5.1 mmol/L  3.7  3.7   Chloride 98 - 111 mmol/L   100   CO2 22 - 32 mmol/L   21   Calcium 8.9 - 10.3 mg/dL   8.7   Total Protein 6.0 - 8.5 g/dL 6.5   5.8   Total Bilirubin 0.0 - 1.2 mg/dL <9.1   0.8   Alkaline Phos 44 - 121 IU/L 95   85   AST 0 - 40 IU/L 16   23   ALT 0 - 44 IU/L 20   64    Lipid Panel     Component Value Date/Time   CHOL 162 04/09/2022 1012   TRIG 155 (H) 04/09/2022 1012   HDL 49 04/09/2022 1012   CHOLHDL 3.3 04/09/2022 1012   CHOLHDL 4.2 03/03/2016 1521   VLDL 36 (H) 03/03/2016 1521   LDLCALC 86 04/09/2022 1012  CBC    Component Value Date/Time   WBC 6.4 07/10/2022 1008   WBC 8.0 01/22/2022 1748   RBC 4.66 07/10/2022 1008   RBC 5.02 01/22/2022 1748   HGB 13.8 07/10/2022 1008   HCT 42.0 07/10/2022 1008   PLT 215 07/10/2022 1008   MCV 90 07/10/2022 1008   MCH 29.6 07/10/2022 1008   MCH 29.9 01/22/2022 1748   MCHC 32.9 07/10/2022 1008   MCHC 33.7 01/22/2022 1748   RDW 13.7 07/10/2022 1008   LYMPHSABS 4.3 (H) 01/22/2022 1748   LYMPHSABS 3.6 (H) 02/18/2017 1115   MONOABS 0.8 01/22/2022 1748   EOSABS 0.1 01/22/2022 1748   EOSABS  0.1 02/18/2017 1115   BASOSABS 0.1 01/22/2022 1748   BASOSABS 0.1 02/18/2017 1115    ASSESSMENT AND PLAN: 1. Annual physical exam Patient to sign release for [place] to get his records from [facility]  I held off on giving any vaccines today.  I would like to review his records from [facility] first -Will bring patient back in 2 weeks to do full medication reconciliation.  Advised that he bring his medication bottles with him.  2. Type 2 diabetes mellitus with obesity (HCC) A1c is at goal even though he stopped taking his medications occasions 3 weeks ago.  Recommend that he restarts the Jardiance and hold off on glipizide. - POCT glucose (manual entry) - POCT glycosylated hemoglobin (Hb A1C) - CBC - Comprehensive metabolic panel - Lipid panel - Microalbumin / creatinine urine ratio - Continuous Glucose Sensor (FREESTYLE LIBRE SENSOR SYSTEM) MISC; Change sensor Q 2 wks  Dispense: 2 each; Refill: 12 - Continuous Glucose Receiver (FREESTYLE LIBRE READER) DEVI; Use to check blood sugars daily.  Dispense: 1 each; Refill: 0  3. Diabetes mellitus treated with oral medication (HCC)   4. PAF (paroxysmal atrial fibrillation) (HCC) Sounds to be in sinus rhythm at this time.  Recommends that he restarts his Eliquis as prescribed by his cardiologist.  Advised to report any excessive bruising or if he has any bleeding.  5. Hyperlipidemia associated with type 2 diabetes mellitus (HCC) Should be on atorvastatin.  Again, he will follow-up with me in 2 weeks and bring all medication bottles with him  6. Hypertension associated with diabetes (HCC) Blood pressure is well-controlled.  He has not been taking any medications and does not recall the names of medicines that he should have been on.  Plan to do medication reconciliation on next visit  7. Positive depression screening Patient feels this is situational.  He declines any medication.  Does not feel that he needs referral for  counseling.  8. Overgrown toenails - Ambulatory referral to Podiatry  9. Prostate cancer screening - PSA  10. Screening for colon cancer - Ambulatory referral to Gastroenterology     Patient was given the opportunity to ask questions.  Patient verbalized understanding of the plan and was able to repeat key elements of the plan.   This documentation was completed using Paediatric nurse.  Any transcriptional errors are unintentional.  Orders Placed This Encounter  Procedures   CBC   Comprehensive metabolic panel   Lipid panel   Microalbumin / creatinine urine ratio   PSA   Ambulatory referral to Gastroenterology   Ambulatory referral to Podiatry   POCT glucose (manual entry)   POCT glycosylated hemoglobin (Hb A1C)     Requested Prescriptions   Signed Prescriptions Disp Refills   Continuous Glucose Sensor (FREESTYLE LIBRE SENSOR SYSTEM) MISC 2  each 12    Sig: Change sensor Q 2 wks   Continuous Glucose Receiver (FREESTYLE LIBRE READER) DEVI 1 each 0    Sig: Use to check blood sugars daily.    Return in about 2 weeks (around 03/16/2023) for Sign release for [place] to get records from [facility].  Justin Blue, MD, FACP

## 2023-03-03 ENCOUNTER — Other Ambulatory Visit: Payer: Self-pay

## 2023-03-03 LAB — COMPREHENSIVE METABOLIC PANEL
ALT: 23 IU/L (ref 0–44)
AST: 21 IU/L (ref 0–40)
Albumin: 4.2 g/dL (ref 3.9–4.9)
Alkaline Phosphatase: 76 IU/L (ref 44–121)
BUN/Creatinine Ratio: 14 (ref 10–24)
BUN: 15 mg/dL (ref 8–27)
Bilirubin Total: 0.3 mg/dL (ref 0.0–1.2)
CO2: 21 mmol/L (ref 20–29)
Calcium: 9 mg/dL (ref 8.6–10.2)
Chloride: 108 mmol/L — ABNORMAL HIGH (ref 96–106)
Creatinine, Ser: 1.08 mg/dL (ref 0.76–1.27)
Globulin, Total: 2.3 g/dL (ref 1.5–4.5)
Glucose: 104 mg/dL — ABNORMAL HIGH (ref 70–99)
Potassium: 4.2 mmol/L (ref 3.5–5.2)
Sodium: 143 mmol/L (ref 134–144)
Total Protein: 6.5 g/dL (ref 6.0–8.5)
eGFR: 74 mL/min/{1.73_m2} (ref >59)

## 2023-03-03 LAB — LIPID PANEL
Chol/HDL Ratio: 4.1 ratio (ref 0.0–5.0)
Cholesterol, Total: 203 mg/dL — ABNORMAL HIGH (ref 100–199)
HDL: 49 mg/dL (ref >39)
LDL Chol Calc (NIH): 133 mg/dL — ABNORMAL HIGH (ref 0–99)
Triglycerides: 117 mg/dL (ref 0–149)
VLDL Cholesterol Cal: 21 mg/dL (ref 5–40)

## 2023-03-03 LAB — CBC
Hematocrit: 42.3 % (ref 37.5–51.0)
Hemoglobin: 13.9 g/dL (ref 13.0–17.7)
MCH: 29.9 pg (ref 26.6–33.0)
MCHC: 32.9 g/dL (ref 31.5–35.7)
MCV: 91 fL (ref 79–97)
Platelets: 212 10*3/uL (ref 150–450)
RBC: 4.65 x10E6/uL (ref 4.14–5.80)
RDW: 13.5 % (ref 11.6–15.4)
WBC: 6 10*3/uL (ref 3.4–10.8)

## 2023-03-03 LAB — MICROALBUMIN / CREATININE URINE RATIO
Creatinine, Urine: 101.1 mg/dL
Microalb/Creat Ratio: 11 mg/g creat (ref 0–29)
Microalbumin, Urine: 11.4 ug/mL

## 2023-03-03 LAB — PSA: Prostate Specific Ag, Serum: 0.6 ng/mL (ref 0.0–4.0)

## 2023-03-04 ENCOUNTER — Other Ambulatory Visit: Payer: Self-pay

## 2023-03-16 ENCOUNTER — Ambulatory Visit: Payer: Medicare PPO | Admitting: Podiatry

## 2023-03-27 ENCOUNTER — Ambulatory Visit: Payer: Self-pay

## 2023-03-27 NOTE — Telephone Encounter (Signed)
Message from De Blanch sent at 03/27/2023 12:23 PM EDT  Summary: med management   Pt stated he has an upcoming appointment with Dr. Laural Benes on Monday. He was asked to bring all of his medication, and this is when he realized he has only been taking Eliquis, and Jardiance stated there are about 5 other pills, including his blood pressure pill, that he should be taking and he is not. Pt asked if he should start taking the other medications or if he should wait until he sees PCP.  Seeking clinical advice.         Chief Complaint: Message from De Blanch sent at 03/27/2023 12:23 PM EDT  Summary: med management   Pt stated he has an upcoming appointment with Dr. Laural Benes on Monday. He was asked to bring all of his medication, and this is when he realized he has only been taking Eliquis, and Jardiance stated there are about 5 other pills, including his blood pressure pill, that he should be taking and he is not. Pt asked if he should start taking the other medications or if he should wait until he sees PCP.  Seeking clinical advice.         Disposition: [] ED /[] Urgent Care (no appt availability in office) / [] Appointment(In office/virtual)/ []  [place] [facility]/ [] Home Care/ [] Refused Recommended Disposition /[] [place] [facility]/ [x]  Follow-up with PCP Additional Notes: advised pt to stay on meds that PCP knows he is taking. Pt stated several times that he will bring pill bottles.   Reason for Disposition  Health Information question, no triage required and triager able to answer question  Answer Assessment - Initial Assessment Questions 1. REASON FOR CALL or QUESTION: "What is your reason for calling today?" or "How can I best help you?" or "What question do you have that I can help answer?"     *No Answer*Message from De Blanch sent at 03/27/2023 12:23 PM EDT  Summary: med management   Pt stated he has an upcoming appointment with Dr. Laural Benes on Monday. He was asked to bring all of  his medication, and this is when he realized he has only been taking Eliquis, and Jardiance stated there are about 5 other pills, including his blood pressure pill, that he should be taking and he is not. Pt asked if he should start taking the other medications or if he should wait until he sees PCP.  Seeking clinical advice.  Protocols used: Information Only Call - No Triage-A-AH

## 2023-03-30 ENCOUNTER — Ambulatory Visit: Payer: Medicare PPO | Attending: Internal Medicine | Admitting: Internal Medicine

## 2023-03-30 ENCOUNTER — Encounter: Payer: Self-pay | Admitting: Internal Medicine

## 2023-03-30 ENCOUNTER — Other Ambulatory Visit: Payer: Self-pay

## 2023-03-30 VITALS — BP 120/73 | HR 65 | Temp 98.1°F | Ht 71.0 in | Wt 281.0 lb

## 2023-03-30 DIAGNOSIS — E1159 Type 2 diabetes mellitus with other circulatory complications: Secondary | ICD-10-CM

## 2023-03-30 DIAGNOSIS — E669 Obesity, unspecified: Secondary | ICD-10-CM

## 2023-03-30 DIAGNOSIS — Z6839 Body mass index (BMI) 39.0-39.9, adult: Secondary | ICD-10-CM

## 2023-03-30 DIAGNOSIS — E1169 Type 2 diabetes mellitus with other specified complication: Secondary | ICD-10-CM | POA: Diagnosis not present

## 2023-03-30 DIAGNOSIS — R3589 Other polyuria: Secondary | ICD-10-CM | POA: Diagnosis not present

## 2023-03-30 DIAGNOSIS — I152 Hypertension secondary to endocrine disorders: Secondary | ICD-10-CM | POA: Diagnosis not present

## 2023-03-30 DIAGNOSIS — Z7189 Other specified counseling: Secondary | ICD-10-CM

## 2023-03-30 DIAGNOSIS — Z7984 Long term (current) use of oral hypoglycemic drugs: Secondary | ICD-10-CM

## 2023-03-30 MED ORDER — ATORVASTATIN CALCIUM 40 MG PO TABS
40.0000 mg | ORAL_TABLET | Freq: Every day | ORAL | 1 refills | Status: AC
Start: 2023-03-30 — End: ?
  Filled 2023-03-30: qty 90, 90d supply, fill #0

## 2023-03-30 MED ORDER — METOPROLOL SUCCINATE ER 25 MG PO TB24
12.5000 mg | ORAL_TABLET | Freq: Every day | ORAL | 1 refills | Status: AC
  Filled 2023-03-30: qty 45, 90d supply, fill #0

## 2023-03-30 MED ORDER — JARDIANCE 10 MG PO TABS
10.0000 mg | ORAL_TABLET | Freq: Every day | ORAL | 1 refills | Status: AC
  Filled 2023-03-30: qty 90, 90d supply, fill #0

## 2023-03-30 MED ORDER — TAMSULOSIN HCL 0.4 MG PO CAPS
0.4000 mg | ORAL_CAPSULE | Freq: Every day | ORAL | 3 refills | Status: AC
  Filled 2023-03-30: qty 30, 30d supply, fill #0

## 2023-03-30 MED ORDER — APIXABAN 5 MG PO TABS
5.0000 mg | ORAL_TABLET | Freq: Two times a day (BID) | ORAL | 6 refills | Status: AC
  Filled 2023-03-30: qty 60, 30d supply, fill #0

## 2023-03-30 MED ORDER — GLIPIZIDE 10 MG PO TABS
10.0000 mg | ORAL_TABLET | Freq: Every day | ORAL | 0 refills | Status: AC
Start: 2023-03-30 — End: ?
  Filled 2023-03-30: qty 30, 30d supply, fill #0

## 2023-03-30 MED ORDER — EZETIMIBE 10 MG PO TABS
10.0000 mg | ORAL_TABLET | Freq: Every day | ORAL | 3 refills | Status: AC
  Filled 2023-03-30: qty 90, 90d supply, fill #0

## 2023-03-30 NOTE — Progress Notes (Signed)
Patient ID: Justin [name], male    DOB: April 20, 1956  MRN: [id number]  CC: Follow-up (Follow-up. /Reports only taking Eliquis & Jardiance. /Pt brought in all med bottles) and Hypertension   Subjective: Justin [name] is a 67 y.o. male who presents for med reconciliation. His concerns today include:  Pt with hx of HTN, HL, PAF, tob dep, DM, obesity, primary OA knees, chronic LBP with surgery 2005   Patient seen 1 month ago as a new patient to reestablish care.  Prior to that visit, he was followed by [facility] for 3 years.  He was not sure of his medications but had his medication box with him.  The only names that he remembered were Eliquis and Jardiance..  Stopped all of his medications for 3 weeks prior to that visit because he had developed what sounded like an abscess on his scalp.  Patient was advised to follow-up with me in 1 month and bring all medication bottles with him that he has at home. Presents today with his medication bottles.  He tells me that the only medicines he is taking are Jardiance 10 mg daily and the Eliquis 5 mg twice a day.  Other medications that he currently has are Zetia 10 mg daily, Cozaar 25 mg half a tablet daily, glipizide 10 mg once a day in the mornings, metoprolol ER 25 mg daily, Flomax 0.4 mg daily, atorvastatin 40 mg daily. He is without any major concerns today.  Not checking blood sugars.  He states that the pharmacy did not give him the continuous glucose monitor that I prescribed on last visit.  He is not sure whether it was denied by the insurance.  Reports that he has been doing great with his eating habits.  He has been walking several days a week. Reports frequent urination at night since being off Flomax.                       Patient Active Problem List   Diagnosis Date Noted   Paroxysmal atrial fibrillation (HCC) 04/09/2022   Pain due to onychomycosis of toenails of both feet 09/14/2020   Controlled type 2 diabetes mellitus without  complication, with long-term current use of insulin (HCC) 03/11/2020   Tetanus, diphtheria, and acellular pertussis (Tdap) vaccination declined 03/08/2020   Pneumococcal vaccination declined 03/08/2020   Primary osteoarthritis of both knees 12/21/2017   Mixed hyperlipidemia 02/23/2017   Essential hypertension 12/27/2016   Tobacco abuse 03/03/2016   Lumbosacral spondylosis without myelopathy 03/08/2014   Lumbar post-laminectomy syndrome 03/08/2014   Dyslipidemia 06/06/2013   Obesity (BMI 30-39.9) 06/06/2013   Low back pain 11/17/2012   Lumbago 11/17/2012     Current Outpatient Medications on File Prior to Visit  Medication Sig Dispense Refill   ACCU-CHEK GUIDE test strip USE AS DIRECTED TO CHECK BLOOD SUGAR DAILY. E11.69 (Patient not taking: Reported on 03/30/2023) 100 each 2   Accu-Chek Softclix Lancets lancets CHECK BLOOD SUGAR ONE TIME A DAY. E11.69 (Patient not taking: Reported on 03/30/2023) 100 each 2   Blood Glucose Monitoring Suppl (ACCU-CHEK GUIDE) w/Device KIT CHECK BLOOD SUGAR ONE TIME A DAY. E11.69 (Patient not taking: Reported on 03/30/2023) 1 kit 0   Continuous Glucose Receiver (FREESTYLE LIBRE READER) DEVI Use to check blood sugars daily. (Patient not taking: Reported on 03/30/2023) 1 each 0   Continuous Glucose Sensor (FREESTYLE LIBRE SENSOR SYSTEM) MISC Change sensor every 2 weeks (Patient not taking: Reported on 03/30/2023) 2 each  12   No current facility-administered medications on file prior to visit.    No Known Allergies  Social History   Socioeconomic History   Marital status: Legally Separated    Spouse name: Not on file   Number of children: Not on file   Years of education: Not on file   Highest education level: Not on file  Occupational History   Not on file  Tobacco Use   Smoking status: Former    Current packs/day: 0.25    Types: Cigarettes   Smokeless tobacco: Never  Vaping Use   Vaping status: Never Used  Substance and Sexual Activity   Alcohol use:  Yes    Alcohol/week: 2.0 - 3.0 standard drinks of alcohol    Types: 2 - 3 Cans of beer per week    Comment: occasional   Drug use: Not Currently   Sexual activity: Not on file  Other Topics Concern   Not on file  Social History Narrative   Not on file   Social Determinants of Health   Financial Resource Strain: Not on file  Food Insecurity: Not on file  Transportation Needs: Not on file  Physical Activity: Not on file  Stress: Not on file  Social Connections: Not on file  Intimate Partner Violence: Not on file    Family History  Problem Relation Age of Onset   Heart disease Mother    Colon cancer Father    Heart disease Father    Esophageal cancer Neg Hx    Stomach cancer Neg Hx    Rectal cancer Neg Hx     Past Surgical History:  Procedure Laterality Date   BACK SURGERY  06/24/2004   [facility]    MULTIPLE EXTRACTIONS WITH ALVEOLOPLASTY N/A 07/03/2017   Procedure: MULTIPLE EXTRACTION WITH ALVEOLOPLASTY;  Surgeon: Ocie Doyne, DDS;  Location: [facility] OR;  Service: Oral Surgery;  Laterality: N/A;   TONSILLECTOMY     UMBILICAL HERNIA REPAIR      ROS: Review of Systems Negative except as stated above  PHYSICAL EXAM: BP 120/73   Pulse 65   Temp 98.1 F (36.7 C) (Oral)   Ht 5\' 11"  (1.803 m)   Wt 281 lb (127.5 kg)   SpO2 97%   BMI 39.19 kg/m   Physical Exam   General appearance - alert, well appearing, older African-American male and in no distress Mental status - normal mood, behavior, speech, dress, motor activity, and thought processes Chest - clear to auscultation, no wheezes, rales or rhonchi, symmetric air entry Heart - normal rate, regular rhythm, normal S1, S2, no murmurs, rubs, clicks or gallops Extremities - peripheral pulses normal, no pedal edema, no clubbing or cyanosis     Latest Ref Rng & Units 03/02/2023    4:36 PM 04/09/2022   10:12 AM 01/22/2022    6:48 PM  CMP  Glucose 70 - 99 mg/dL 629     BUN 8 - 27 mg/dL 15     Creatinine 5.28 - 1.27 mg/dL  4.13     Sodium 244 - 144 mmol/L 143   134   Potassium 3.5 - 5.2 mmol/L 4.2   3.7   Chloride 96 - 106 mmol/L 108     CO2 20 - 29 mmol/L 21     Calcium 8.6 - 10.2 mg/dL 9.0     Total Protein 6.0 - 8.5 g/dL 6.5  6.5    Total Bilirubin 0.0 - 1.2 mg/dL 0.3  <0.1    Alkaline Phos 44 -  121 IU/L 76  95    AST 0 - 40 IU/L 21  16    ALT 0 - 44 IU/L 23  20     Lipid Panel     Component Value Date/Time   CHOL 203 (H) 03/02/2023 1636   TRIG 117 03/02/2023 1636   HDL 49 03/02/2023 1636   CHOLHDL 4.1 03/02/2023 1636   CHOLHDL 4.2 03/03/2016 1521   VLDL 36 (H) 03/03/2016 1521   LDLCALC 133 (H) 03/02/2023 1636    CBC    Component Value Date/Time   WBC 6.0 03/02/2023 1636   WBC 8.0 01/22/2022 1748   RBC 4.65 03/02/2023 1636   RBC 5.02 01/22/2022 1748   HGB 13.9 03/02/2023 1636   HCT 42.3 03/02/2023 1636   PLT 212 03/02/2023 1636   MCV 91 03/02/2023 1636   MCH 29.9 03/02/2023 1636   MCH 29.9 01/22/2022 1748   MCHC 32.9 03/02/2023 1636   MCHC 33.7 01/22/2022 1748   RDW 13.5 03/02/2023 1636   LYMPHSABS 4.3 (H) 01/22/2022 1748   LYMPHSABS 3.6 (H) 02/18/2017 1115   MONOABS 0.8 01/22/2022 1748   EOSABS 0.1 01/22/2022 1748   EOSABS 0.1 02/18/2017 1115   BASOSABS 0.1 01/22/2022 1748   BASOSABS 0.1 02/18/2017 1115    ASSESSMENT AND PLAN: 1. Medication care plan discussed with patient Patient will restart atorvastatin 40 mg , Zetia 10 mg, Jardiance 10 mg, glipizide 10 mg and metoprolol XL 25 mg 1/2 tab daily.  We have decreased the metoprolol from 25 mg once a day to half a tablet daily.  Given his blood pressure today, we will hold off on restarting the Cozaar.  2. Type 2 diabetes mellitus with obesity (HCC) See #1 above. - atorvastatin (LIPITOR) 40 MG tablet; Take 1 tablet (40 mg total) by mouth daily.  Dispense: 90 tablet; Refill: 1 - JARDIANCE 10 MG TABS tablet; Take 1 tablet (10 mg total) by mouth daily.  Dispense: 90 tablet; Refill: 1 - glipiZIDE (GLUCOTROL) 10 MG tablet; Take 1  tablet (10 mg total) by mouth daily before breakfast.  Dispense: 30 tablet; Refill: 0  3. Hypertension associated with diabetes (HCC) Restart metoprolol XL 25 mg at half tablet daily - metoprolol succinate (TOPROL-XL) 25 MG 24 hr tablet; Take 0.5 tablets (12.5 mg total) by mouth daily.  Dispense: 45 tablet; Refill: 1  4. Polyuria Still has symptoms suggestive of BPH.  Advised restarting Flomax. - tamsulosin (FLOMAX) 0.4 MG CAPS capsule; Take 1 capsule (0.4 mg total) by mouth daily.  Dispense: 30 capsule; Refill: 3     Patient was given the opportunity to ask questions.  Patient verbalized understanding of the plan and was able to repeat key elements of the plan.   This documentation was completed using Paediatric nurse.  Any transcriptional errors are unintentional.  No orders of the defined types were placed in this encounter.    Requested Prescriptions   Signed Prescriptions Disp Refills   apixaban (ELIQUIS) 5 MG TABS tablet 60 tablet 6    Sig: Take 1 tablet (5 mg total) by mouth 2 (two) times daily.   atorvastatin (LIPITOR) 40 MG tablet 90 tablet 1    Sig: Take 1 tablet (40 mg total) by mouth daily.   JARDIANCE 10 MG TABS tablet 90 tablet 1    Sig: Take 1 tablet (10 mg total) by mouth daily.   glipiZIDE (GLUCOTROL) 10 MG tablet 30 tablet 0    Sig: Take 1 tablet (10 mg total)  by mouth daily before breakfast.   metoprolol succinate (TOPROL-XL) 25 MG 24 hr tablet 45 tablet 1    Sig: Take 0.5 tablets (12.5 mg total) by mouth daily.   tamsulosin (FLOMAX) 0.4 MG CAPS capsule 30 capsule 3    Sig: Take 1 capsule (0.4 mg total) by mouth daily.    Return in about 4 months (around 07/30/2023).  Jonah Blue, MD, FACP

## 2023-03-30 NOTE — Patient Instructions (Signed)
I have sent refills on your medications to our pharmacy.  You can get them filled once you run out of your current bottles.

## 2023-04-06 ENCOUNTER — Other Ambulatory Visit: Payer: Self-pay

## 2023-04-30 ENCOUNTER — Telehealth: Payer: Self-pay

## 2023-04-30 NOTE — Telephone Encounter (Signed)
Copied from CRM [id number]. Topic: General - Other >> Apr 30, 2023  1:42 PM Justin [name] wrote: Reason for CRM: Pt is calling to check with his PCP in regards to his colonoscopy. Pt states that when he was going to take the Cologuard test, he was told not to take it due to him taking: apixaban (ELIQUIS) 5 MG TABS tablet . Pt was told if his doctor approves him being off of the Eliquis then he can have the colonoscopy done but they need approval from his PCP. Please advise.

## 2023-04-30 NOTE — Telephone Encounter (Signed)
Yes it is okay for him to hold the Eliquis for 2 full days prior to having the colonoscopy done.  For example, if he will have the colonoscopy on Monday, then he should not take the Eliquis Saturday and Sunday.

## 2023-05-01 ENCOUNTER — Telehealth: Payer: Self-pay | Admitting: Internal Medicine

## 2023-05-01 NOTE — Telephone Encounter (Signed)
Copied from CRM [id number]. Topic: Referral - Status >> Apr 30, 2023  1:36 PM Dondra Prader A wrote: Reason for CRM: Pt was referred to  [facility] [address] [place], [place] [address] [phone] Pt was suppose to see Dr. Helane Gunther and states when he went to his appt he was told he would have to pay $69.00, per pt he does not have to pay for his appts. Pt then received a letter in the mail that he was a no show at the [facility] location. Pt states that he went to Dr. Stacie Acres office close to his house off [address] close to [employer], per pt this is the office where he was told to go to. Please advise.

## 2023-05-01 NOTE — Telephone Encounter (Signed)
Called but no answer. LVM to call back.  

## 2023-05-04 NOTE — Telephone Encounter (Signed)
The next step plan is for you to call [facility] and let them know that it is okay for the patient to be off Eliquis for a full 2 days prior to having his colonoscopy.

## 2023-05-04 NOTE — Telephone Encounter (Signed)
Patient called back. Patient stated that PCP may have to contact the office to give the clearance that patient can be off Eliquis for the appropriate amount of time to allow for a colonoscopy. Please advise. Patient stated that the office he is being seen at is [facility]  Address: [address], [place], [place] [address] Phone #: [phone].  Fax #: [phone]

## 2023-05-05 NOTE — Telephone Encounter (Signed)
Called [facility] and spoke to [place]. Informed [place] that Dr.Johnson has approved for the patient to suspend Eliquis for two full days prior to his colonoscopy. [place] expressed verbal understanding. [place] stated that they will call him to get him scheduled for an office visit prior to his colonoscopy. No further questions at this time.

## 2023-05-12 ENCOUNTER — Telehealth: Payer: Self-pay

## 2023-05-12 ENCOUNTER — Ambulatory Visit: Payer: Medicare PPO | Attending: Internal Medicine

## 2023-05-12 NOTE — Telephone Encounter (Unsigned)
Copied from CRM [id number]. Topic: General - Inquiry >> May 12, 2023  1:15 PM Lennox Pippins wrote: Patient stated he had a missed call from someone, no documentation patient was called. Please advise and contact patient back  @ #[phone]  Patient talked to the receptionist about this he stated. Patient would like to get this sorted out about his Medicare well visit. He stated he came in person for this visit as he was told too and then was told to have it over phone. Please advise.

## 2023-05-22 ENCOUNTER — Ambulatory Visit: Payer: Self-pay

## 2023-05-22 NOTE — Telephone Encounter (Signed)
He needs to go to the ED.

## 2023-05-22 NOTE — Telephone Encounter (Signed)
Attempted to contact pt and left VM informing him to go to the ED.

## 2023-05-22 NOTE — Telephone Encounter (Signed)
PCP 03/30/23 off Eliquis for colonoscopy been off since aug 5th explain to  Intermittent left arm pain.  Needs coloscopy Canceled    Chief Complaint: Justin [name] since Aug 5th for colonoscopy which was canceled.  Symptoms: top of left shoulder pain that comes and goes Frequency: August Pertinent Negatives: Patient denies sweatng, SOB, back pain, jaw pain chest pain  Disposition: [] ED /[] Urgent Care (no appt availability in office) / [] Appointment(In office/virtual)/ []  [facility]/ [] Home Care/ [] Refused Recommended Disposition /[] [facility]/ [x]  Follow-up with PCP Additional Notes: pt advise pt wwhat to do .He never had the colonoscopy and has been off med.   Answer Assessment - Initial Assessment Questions 1. LOCATION: "Where does it hurt?"       Tap of shoulder  2. RADIATION: "Does the pain go anywhere else?" (e.g., into neck, jaw, arms, back)      4. PATTERN: "Does the pain come and go, or has it been constant since it started?"  "Does it get worse with exertion?"      Comes and goes 6. SEVERITY: "How bad is the pain?"  (e.g., Scale 1-10; mild, moderate, or severe)    - MILD (1-3): doesn't interfere with normal activities     - MODERATE (4-7): interferes with normal activities or awakens from sleep    - SEVERE (8-10): excruciating pain, unable to do any normal activities       Mild  7. CARDIAC RISK FACTORS: "Do you have any history of heart problems or risk factors for heart disease?" (e.g., angina, prior heart attack; diabetes, high blood pressure, high cholesterol, smoker, or strong family history of heart disease)     HTN  9. CAUSE: "What do you think is causing the chest pain?"     Off of Eliquis 10. OTHER SYMPTOMS: "Do you have any other symptoms?" (e.g., dizziness, nausea, vomiting, sweating, fever, difficulty breathing, cough)       Sleeping. Sneezing   11. PREGNANCY: "Is there any chance you are pregnant?" "When was your last menstrual period?"        N/a  Protocols used: Chest Pain-A-AH

## 2023-06-23 ENCOUNTER — Ambulatory Visit: Payer: Medicare PPO | Attending: Internal Medicine

## 2023-06-23 VITALS — Ht 71.0 in | Wt 280.0 lb

## 2023-06-23 DIAGNOSIS — Z7985 Long-term (current) use of injectable non-insulin antidiabetic drugs: Secondary | ICD-10-CM

## 2023-06-23 DIAGNOSIS — Z7984 Long term (current) use of oral hypoglycemic drugs: Secondary | ICD-10-CM | POA: Diagnosis not present

## 2023-06-23 DIAGNOSIS — Z0000 Encounter for general adult medical examination without abnormal findings: Secondary | ICD-10-CM

## 2023-06-23 NOTE — Progress Notes (Cosign Needed Addendum)
Subjective:   Justin [name] is a 68 y.o. male who presents for Medicare Annual/Subsequent preventive examination.  Visit Complete: Virtual I connected with  Lorrin Mais on 06/23/23 by a audio enabled telemedicine application and verified that I am speaking with the correct person using two identifiers.  Patient Location: Home  Provider Location: Office/Clinic  I discussed the limitations of evaluation and management by telemedicine. The patient expressed understanding and agreed to proceed.  Vital Signs: Because this visit was a virtual/telehealth visit, some criteria may be missing or patient reported. Any vitals not documented were not able to be obtained and vitals that have been documented are patient reported.  Cardiac Risk Factors include: advanced age (>28men, >6 women);diabetes mellitus;dyslipidemia;family history of premature cardiovascular disease;hypertension;male gender;obesity (BMI >30kg/m2)     Objective:    Today's Vitals   06/23/23 0922 06/23/23 0924  Weight: 280 lb (127 kg)   Height: 5\' 11"  (1.803 m)   PainSc: 5  5   PainLoc: Back    Body mass index is 39.05 kg/m.     06/23/2023    9:29 AM 01/22/2022    4:53 PM 01/18/2022    4:25 PM 06/21/2020    2:08 PM 07/01/2017    1:04 PM 05/07/2017   11:48 AM 03/12/2017    8:42 AM  Advanced Directives  Does Patient Have a Medical Advance Directive? Yes No No No No No No  Type of Estate agent of [place];Living will        Copy of Healthcare Power of Attorney in Chart? No - copy requested        Would patient like information on creating a medical advance directive?   No - Patient declined  No - Patient declined      Current Medications (verified) Outpatient Encounter Medications as of 06/23/2023  Medication Sig   ACCU-CHEK GUIDE test strip USE AS DIRECTED TO CHECK BLOOD SUGAR DAILY. E11.69 (Patient not taking: Reported on 03/30/2023)   Accu-Chek Softclix Lancets lancets CHECK BLOOD SUGAR  ONE TIME A DAY. E11.69 (Patient not taking: Reported on 03/30/2023)   apixaban (ELIQUIS) 5 MG TABS tablet Take 1 tablet (5 mg total) by mouth 2 (two) times daily.   atorvastatin (LIPITOR) 40 MG tablet Take 1 tablet (40 mg total) by mouth daily.   Blood Glucose Monitoring Suppl (ACCU-CHEK GUIDE) w/Device KIT CHECK BLOOD SUGAR ONE TIME A DAY. E11.69 (Patient not taking: Reported on 03/30/2023)   Continuous Glucose Receiver (FREESTYLE LIBRE READER) DEVI Use to check blood sugars daily. (Patient not taking: Reported on 03/30/2023)   Continuous Glucose Sensor (FREESTYLE LIBRE SENSOR SYSTEM) MISC Change sensor every 2 weeks (Patient not taking: Reported on 03/30/2023)   ezetimibe (ZETIA) 10 MG tablet Take 1 tablet (10 mg total) by mouth daily.   glipiZIDE (GLUCOTROL) 10 MG tablet Take 1 tablet (10 mg total) by mouth daily before breakfast.   JARDIANCE 10 MG TABS tablet Take 1 tablet (10 mg total) by mouth daily.   metoprolol succinate (TOPROL-XL) 25 MG 24 hr tablet Take 0.5 tablets (12.5 mg total) by mouth daily.   tamsulosin (FLOMAX) 0.4 MG CAPS capsule Take 1 capsule (0.4 mg total) by mouth daily.   No facility-administered encounter medications on file as of 06/23/2023.    Allergies (verified) Patient has no known allergies.   History: Past Medical History:  Diagnosis Date   A-fib [facility])    Arthritis    states he was given med. for inflammation in his "bones"  but he isn't taking it right now, remarks " i'm not going to to take something they just throw at me for no reason"   Diabetes mellitus (HCC)    Dyspnea    pt. reports this SOB worsens wioth increased heat    Former smoker    Hypercholesteremia    Hypertension    Obesity    Past Surgical History:  Procedure Laterality Date   BACK SURGERY  06/24/2004   [facility]-    MULTIPLE EXTRACTIONS WITH ALVEOLOPLASTY N/A 07/03/2017   Procedure: MULTIPLE EXTRACTION WITH ALVEOLOPLASTY;  Surgeon: Ocie Doyne, DDS;  Location: [facility] OR;  Service: Oral Surgery;   Laterality: N/A;   TONSILLECTOMY     UMBILICAL HERNIA REPAIR     Family History  Problem Relation Age of Onset   Heart disease Mother    Colon cancer Father    Heart disease Father    Esophageal cancer Neg Hx    Stomach cancer Neg Hx    Rectal cancer Neg Hx    Social History   Socioeconomic History   Marital status: Legally Separated    Spouse name: Not on file   Number of children: Not on file   Years of education: Not on file   Highest education level: Not on file  Occupational History   Not on file  Tobacco Use   Smoking status: Former    Current packs/day: 0.25    Types: Cigarettes   Smokeless tobacco: Never  Vaping Use   Vaping status: Never Used  Substance and Sexual Activity   Alcohol use: Yes    Alcohol/week: 2.0 - 3.0 standard drinks of alcohol    Types: 2 - 3 Cans of beer per week    Comment: occasional   Drug use: Not Currently   Sexual activity: Not on file  Other Topics Concern   Not on file  Social History Narrative   Not on file   Social Determinants of Health   Financial Resource Strain: Low Risk  (06/23/2023)   Overall Financial Resource Strain (CARDIA)    Difficulty of Paying Living Expenses: Not hard at all  Food Insecurity: No Food Insecurity (06/23/2023)   Hunger Vital Sign    Worried About Running Out of Food in the Last Year: Never true    Ran Out of Food in the Last Year: Never true  Transportation Needs: No Transportation Needs (06/23/2023)   PRAPARE - Administrator, Civil Service (Medical): No    Lack of Transportation (Non-Medical): No  Physical Activity: Sufficiently Active (06/23/2023)   Exercise Vital Sign    Days of Exercise per Week: 5 days    Minutes of Exercise per Session: 30 min  Stress: No Stress Concern Present (06/23/2023)   [employer] of Occupational Health - Occupational Stress Questionnaire    Feeling of Stress : Not at all  Social Connections: Moderately Integrated (06/23/2023)   Social  Connection and Isolation Panel [NHANES]    Frequency of Communication with Friends and Family: More than three times a week    Frequency of Social Gatherings with Friends and Family: More than three times a week    Attends Religious Services: 1 to 4 times per year    Active Member of [employer] or Organizations: Yes    Attends Banker Meetings: 1 to 4 times per year    Marital Status: Never married    Tobacco Counseling Counseling given: Not Answered   Clinical Intake:  Pre-visit preparation completed: Yes  Pain : 0-10 Pain Score: 5  Pain Type: Chronic pain Pain Location: Back Pain Orientation: Lower Effect of Pain on Daily Activities: Pain can diminish job performance, lower motivation to exercise and prevent you from completing daily tasks.  Pain produces disability and affects the quality of life.     BMI - recorded: 39.05 Nutritional Status: BMI > 30  Obese Nutritional Risks: None Diabetes: Yes CBG done?: No Did pt. bring in CBG monitor from home?: No  How often do you need to have someone help you when you read instructions, pamphlets, or other written materials from your doctor or pharmacy?: 1 - Never What is the last grade level you completed in school?: HSG; [employer]  Interpreter Needed?: No  Information entered by :: Susie Cassette, LPN.   Activities of Daily Living    06/23/2023    9:32 AM  In your present state of health, do you have any difficulty performing the following activities:  Hearing? 0  Vision? 0  Difficulty concentrating or making decisions? 0  Walking or climbing stairs? 0  Dressing or bathing? 0  Doing errands, shopping? 0  Preparing Food and eating ? N  Using the Toilet? N  In the past six months, have you accidently leaked urine? N  Do you have problems with loss of bowel control? N  Managing your Medications? N  Managing your Finances? N  Housekeeping or managing your Housekeeping? N    Patient Care Team: Marcine Matar, MD as PCP - General (Internal Medicine) Runell Gess, MD as PCP - Cardiology (Cardiology) Ranelle Oyster, MD as Consulting Physician (Physical Medicine and Rehabilitation)  Indicate any recent Medical Services you may have received from other than [facility] providers in the past year (date may be approximate).     Assessment:   This is a routine wellness examination for [employer].  Hearing/Vision screen Hearing Screening - Comments:: Patient denied any hearing difficulty.   No hearing aids.  Vision Screening - Comments:: Patient does wear corrective lenses/contacts.  Annual eye exam done by: [facility]    Goals Addressed             This Visit's Progress    Client understands the importance of follow-up with providers by attending scheduled visits        Depression Screen    06/23/2023    9:31 AM 03/30/2023    2:12 PM 03/02/2023    4:22 PM 06/21/2020    2:15 PM 03/08/2020   11:35 AM 03/08/2020    9:52 AM 12/21/2017    1:44 PM  PHQ 2/9 Scores  PHQ - 2 Score 0 1 1 0 0 0 1  PHQ- 9 Score 0 1 6        Fall Risk    06/23/2023    9:30 AM 03/02/2023    2:45 PM 07/13/2020   10:03 AM 06/21/2020    2:10 PM 03/08/2020    9:50 AM  Fall Risk   Falls in the past year? 0 0 1 1 0  Number falls in past yr: 0 0 0 0 0  Injury with Fall? 0 0 0 0 1  Risk for fall due to : No Fall Risks No Fall Risks     Follow up Falls prevention discussed        MEDICARE RISK AT HOME: Medicare Risk at Home Any stairs in or around the home?: No If so, are there any without handrails?: No Home free of loose  throw rugs in walkways, pet beds, electrical cords, etc?: Yes Adequate lighting in your home to reduce risk of falls?: Yes Life alert?: Yes Use of a cane, walker or w/c?: No Grab bars in the bathroom?: Yes Shower chair or bench in shower?: No Elevated toilet seat or a handicapped toilet?: Yes  TIMED UP AND GO:  Was the test performed?  No    Cognitive Function: Normal  cognitive status assessed by direct observation via telephone conversation by this Nurse Health Advisor. No abnormalities found.    06/23/2023    9:31 AM 06/21/2020    2:22 PM  MMSE - Mini Mental State Exam  Not completed: Unable to complete   Orientation to time  4  Orientation to Place  5  Registration  3  Attention/ Calculation  5  Recall  3  Language- name 2 objects  2  Language- repeat  1  Language- follow 3 step command  3  Language- read & follow direction  1  Write a sentence  1  Copy design  1  Total score  29        06/23/2023    9:31 AM  6CIT Screen  What Year? 0 points  What month? 0 points  What time? 0 points  Count back from 20 0 points  Months in reverse 0 points  Repeat phrase 0 points  Total Score 0 points    Immunizations  There is no immunization history on file for this patient.  TDAP status: Due, Education has been provided regarding the importance of this vaccine. Advised may receive this vaccine at local pharmacy or Health Dept. Aware to provide a copy of the vaccination record if obtained from local pharmacy or Health Dept. Verbalized acceptance and understanding.  Flu Vaccine status: Declined, Education has been provided regarding the importance of this vaccine but patient still declined. Advised may receive this vaccine at local pharmacy or Health Dept. Aware to provide a copy of the vaccination record if obtained from local pharmacy or Health Dept. Verbalized acceptance and understanding.  Pneumococcal vaccine status: Declined,  Education has been provided regarding the importance of this vaccine but patient still declined. Advised may receive this vaccine at local pharmacy or Health Dept. Aware to provide a copy of the vaccination record if obtained from local pharmacy or Health Dept. Verbalized acceptance and understanding.   Covid-19 vaccine status: Declined, Education has been provided regarding the importance of this vaccine but patient  still declined. Advised may receive this vaccine at local pharmacy or Health Dept.or vaccine clinic. Aware to provide a copy of the vaccination record if obtained from local pharmacy or Health Dept. Verbalized acceptance and understanding.  Qualifies for Shingles Vaccine? Yes   Zostavax completed No   Shingrix Completed?: No.    Education has been provided regarding the importance of this vaccine. Patient has been advised to call insurance company to determine out of pocket expense if they have not yet received this vaccine. Advised may also receive vaccine at local pharmacy or Health Dept. Verbalized acceptance and understanding.  Screening Tests Health Maintenance  Topic Date Due   COVID-19 Vaccine (1) Never done   DTaP/Tdap/Td (1 - Tdap) Never done   Zoster Vaccines- Shingrix (1 of 2) Never done   Pneumonia Vaccine 31+ Years old (1 of 1 - PCV) Never done   Colonoscopy  10/15/2020   INFLUENZA VACCINE  Never done   HEMOGLOBIN A1C  09/02/2023   OPHTHALMOLOGY EXAM  12/24/2023  Diabetic kidney evaluation - eGFR measurement  03/01/2024   Diabetic kidney evaluation - Urine ACR  03/01/2024   FOOT EXAM  03/01/2024   Medicare Annual Wellness (AWV)  06/22/2024   Hepatitis C Screening  Completed   HPV VACCINES  Aged Out    Health Maintenance  Health Maintenance Due  Topic Date Due   COVID-19 Vaccine (1) Never done   DTaP/Tdap/Td (1 - Tdap) Never done   Zoster Vaccines- Shingrix (1 of 2) Never done   Pneumonia Vaccine 14+ Years old (1 of 1 - PCV) Never done   Colonoscopy  10/15/2020   INFLUENZA VACCINE  Never done    Colorectal cancer screening: Patient stated that he is taking Eliquis and Gastroenterology would need to speak with PCP.  Lung Cancer Screening: (Low Dose CT Chest recommended if Age 85-80 years, 20 pack-year currently smoking OR have quit w/in 15years.) does not qualify.   Lung Cancer Screening Referral: no  Additional Screening:  Hepatitis C Screening: does qualify;  Completed 12/21/2017  Vision Screening: Recommended annual ophthalmology exams for early detection of glaucoma and other disorders of the eye. Is the patient up to date with their annual eye exam?  Yes  Who is the provider or what is the name of the office in which the patient attends annual eye exams? [facility] If pt is not established with a provider, would they like to be referred to a provider to establish care? No .   Dental Screening: Recommended annual dental exams for proper oral hygiene  Diabetic Foot Exam: Diabetic Foot Exam: Completed 03/02/2023  Community Resource Referral / Chronic Care Management: CRR required this visit?  No   CCM required this visit?  No     Plan:     I have personally reviewed and noted the following in the patient's chart:   Medical and social history Use of alcohol, tobacco or illicit drugs  Current medications and supplements including opioid prescriptions. Patient is not currently taking opioid prescriptions. Functional ability and status Nutritional status Physical activity Advanced directives List of other physicians Hospitalizations, surgeries, and ER visits in previous 12 months Vitals Screenings to include cognitive, depression, and falls Referrals and appointments  In addition, I have reviewed and discussed with patient certain preventive protocols, quality metrics, and best practice recommendations. A written personalized care plan for preventive services as well as general preventive health recommendations were provided to patient.     Mickeal Needy, LPN   [id number]   After Visit Summary: (MyChart) Due to this being a telephonic visit, the after visit summary with patients personalized plan was offered to patient via MyChart   Nurse Notes: None

## 2023-06-23 NOTE — Patient Instructions (Signed)
Justin [name] , Thank you for taking time to come for your Medicare Wellness Visit. I appreciate your ongoing commitment to your health goals. Please review the following plan we discussed and let me know if I can assist you in the future.   Referrals/Orders/Follow-Ups/Clinician Recommendations: no  This is a list of the screening recommended for you and due dates:  Health Maintenance  Topic Date Due   COVID-19 Vaccine (1) Never done   DTaP/Tdap/Td vaccine (1 - Tdap) Never done   Zoster (Shingles) Vaccine (1 of 2) Never done   Pneumonia Vaccine (1 of 1 - PCV) Never done   Colon Cancer Screening  10/15/2020   Flu Shot  Never done   Hemoglobin A1C  09/02/2023   Eye exam for diabetics  12/24/2023   Yearly kidney function blood test for diabetes  03/01/2024   Yearly kidney health urinalysis for diabetes  03/01/2024   Complete foot exam   03/01/2024   Medicare Annual Wellness Visit  06/22/2024   Hepatitis C Screening  Completed   HPV Vaccine  Aged Out    Advanced directives: (Copy Requested) Please bring a copy of your health care power of attorney and living will to the office to be added to your chart at your convenience.  Next Medicare Annual Wellness Visit scheduled for next year: Yes

## 2023-07-01 ENCOUNTER — Encounter: Payer: Self-pay | Admitting: Physician Assistant

## 2023-07-30 ENCOUNTER — Ambulatory Visit: Payer: Medicare PPO | Admitting: Internal Medicine

## 2023-09-28 ENCOUNTER — Ambulatory Visit: Payer: Medicare Other | Admitting: Physician Assistant

## 2023-09-28 NOTE — Progress Notes (Deleted)
 09/28/2023 Justin [name] [id number] 1956/06/19  Referring provider: Marcine Matar, MD Primary GI doctor: Dr. Rhea Belton  ASSESSMENT AND PLAN:   Assessment and Plan              Patient Care Team: Marcine Matar, MD as PCP - General (Internal Medicine) Runell Gess, MD as PCP - Cardiology (Cardiology) Ranelle Oyster, MD as Consulting Physician (Physical Medicine and Rehabilitation)  HISTORY OF PRESENT ILLNESS: 68 y.o. male with a past medical history of type 2 diabetes on insulin, tobacco use, atrial fibrillation on Eliquis and others listed below presents for evaluation of colonoscopy.   Patient had previous colonoscopy 2017 with Dr. Rhea Belton that showed 1 sessile polyp tubular adenoma no high-grade dysplasia.  Patient's father has history of adenomatous polyps. Patient follows with Dr. Pearletha Forge atrial fibrillation first episode 01/22/2022.  Last saw cardiology 10/10/2022 without chest pain or shortness of breath.  Rate controlled on metoprolol on Eliquis. Patient was having uncontrolled diabetes last A1c checked that I can see was 01/18/2022 at 12.8  Discussed the use of AI scribe software for clinical note transcription with the patient, who gave verbal consent to proceed.  History of Present Illness             He  reports that he has quit smoking. His smoking use included cigarettes. He has never used smokeless tobacco. He reports current alcohol use of about 2.0 - 3.0 standard drinks of alcohol per week. He reports that he does not currently use drugs.  RELEVANT GI HISTORY, LABS, IMAGING: 10/16/2015 colonoscopy for screening purposes and family history of colon adenoma and father good prep 1. Sessile polyp was found in the rectum; polypectomy was performed with a cold snare 2. The examination was otherwise normal - TUBULAR ADENOMA(X1). - HIGH GRADE DYSPLASIA IS NOT IDENTIFIED  CBC    Component Value Date/Time   WBC 6.0 03/02/2023 1636   WBC 8.0  01/22/2022 1748   RBC 4.65 03/02/2023 1636   RBC 5.02 01/22/2022 1748   HGB 13.9 03/02/2023 1636   HCT 42.3 03/02/2023 1636   PLT 212 03/02/2023 1636   MCV 91 03/02/2023 1636   MCH 29.9 03/02/2023 1636   MCH 29.9 01/22/2022 1748   MCHC 32.9 03/02/2023 1636   MCHC 33.7 01/22/2022 1748   RDW 13.5 03/02/2023 1636   LYMPHSABS 4.3 (H) 01/22/2022 1748   LYMPHSABS 3.6 (H) 02/18/2017 1115   MONOABS 0.8 01/22/2022 1748   EOSABS 0.1 01/22/2022 1748   EOSABS 0.1 02/18/2017 1115   BASOSABS 0.1 01/22/2022 1748   BASOSABS 0.1 02/18/2017 1115   Recent Labs    03/02/23 1636  HGB 13.9    CMP     Component Value Date/Time   NA 143 03/02/2023 1636   K 4.2 03/02/2023 1636   CL 108 (H) 03/02/2023 1636   CO2 21 03/02/2023 1636   GLUCOSE 104 (H) 03/02/2023 1636   GLUCOSE 187 (H) 01/22/2022 1748   BUN 15 03/02/2023 1636   CREATININE 1.08 03/02/2023 1636   CREATININE 1.20 03/03/2016 1521   CALCIUM 9.0 03/02/2023 1636   PROT 6.5 03/02/2023 1636   ALBUMIN 4.2 03/02/2023 1636   AST 21 03/02/2023 1636   ALT 23 03/02/2023 1636   ALKPHOS 76 03/02/2023 1636   BILITOT 0.3 03/02/2023 1636   GFRNONAA >60 01/22/2022 1748   GFRNONAA 64 03/03/2016 1521   GFRAA 81 03/08/2020 1122   GFRAA 74 03/03/2016 1521      Latest  Ref Rng & Units 03/02/2023    4:36 PM 04/09/2022   10:12 AM 01/22/2022    5:48 PM  Hepatic Function  Total Protein 6.0 - 8.5 g/dL 6.5  6.5  5.8   Albumin 3.9 - 4.9 g/dL 4.2  4.3  3.2   AST 0 - 40 IU/L 21  16  23    ALT 0 - 44 IU/L 23  20  64   Alk Phosphatase 44 - 121 IU/L 76  95  85   Total Bilirubin 0.0 - 1.2 mg/dL 0.3  <1.6  0.8   Bilirubin, Direct 0.00 - 0.40 mg/dL  <1.09        Current Medications:   Current Outpatient Medications (Endocrine & Metabolic):    glipiZIDE (GLUCOTROL) 10 MG tablet, Take 1 tablet (10 mg total) by mouth daily before breakfast.   JARDIANCE 10 MG TABS tablet, Take 1 tablet (10 mg total) by mouth daily.  Current Outpatient Medications  (Cardiovascular):    atorvastatin (LIPITOR) 40 MG tablet, Take 1 tablet (40 mg total) by mouth daily.   ezetimibe (ZETIA) 10 MG tablet, Take 1 tablet (10 mg total) by mouth daily.   metoprolol succinate (TOPROL-XL) 25 MG 24 hr tablet, Take 0.5 tablets (12.5 mg total) by mouth daily.    Current Outpatient Medications (Hematological):    apixaban (ELIQUIS) 5 MG TABS tablet, Take 1 tablet (5 mg total) by mouth 2 (two) times daily.  Current Outpatient Medications (Other):    ACCU-CHEK GUIDE test strip, USE AS DIRECTED TO CHECK BLOOD SUGAR DAILY. E11.69 (Patient not taking: Reported on 03/30/2023)   Accu-Chek Softclix Lancets lancets, CHECK BLOOD SUGAR ONE TIME A DAY. E11.69 (Patient not taking: Reported on 03/30/2023)   Blood Glucose Monitoring Suppl (ACCU-CHEK GUIDE) w/Device KIT, CHECK BLOOD SUGAR ONE TIME A DAY. E11.69 (Patient not taking: Reported on 03/30/2023)   Continuous Glucose Receiver (FREESTYLE LIBRE READER) DEVI, Use to check blood sugars daily. (Patient not taking: Reported on 03/30/2023)   Continuous Glucose Sensor (FREESTYLE LIBRE SENSOR SYSTEM) MISC, Change sensor every 2 weeks (Patient not taking: Reported on 03/30/2023)   tamsulosin (FLOMAX) 0.4 MG CAPS capsule, Take 1 capsule (0.4 mg total) by mouth daily.  Medical History:  Past Medical History:  Diagnosis Date   A-fib [facility])    Arthritis    states he was given med. for inflammation in his "bones" but he isn't taking it right now, remarks " i'm not going to to take something they just throw at me for no reason"   Diabetes mellitus (HCC)    Dyspnea    pt. reports this SOB worsens wioth increased heat    Former smoker    Hypercholesteremia    Hypertension    Obesity    Allergies: No Known Allergies   Surgical History:  He  has a past surgical history that includes Tonsillectomy; Umbilical hernia repair; Back surgery (06/24/2004); and Multiple extractions with alveoloplasty (N/A, 07/03/2017). Family History:  His family history  includes Colon cancer in his father; Heart disease in his father and mother.  REVIEW OF SYSTEMS  : All other systems reviewed and negative except where noted in the History of Present Illness.  PHYSICAL EXAM: There were no vitals taken for this visit. General Appearance: Well nourished, in no apparent distress. Head:   Normocephalic and atraumatic. Eyes:  sclerae anicteric,conjunctive pink  Respiratory: Respiratory effort normal, BS equal bilaterally without rales, rhonchi, wheezing. Cardio: RRR with no MRGs. Peripheral pulses intact.  Abdomen: Soft,  {BlankSingle:19197::"Flat","Obese","Non-distended"} ,active  bowel sounds. {actendernessAB:27319} tenderness {anatomy; site abdomen:5010}. {BlankMultiple:19196::"Without guarding","With guarding","Without rebound","With rebound"}. No masses. Rectal: {acrectalexam:27461} Musculoskeletal: Full ROM, {PSY - GAIT AND STATION:22860} gait. {With/Without:[id number]} edema. Skin:  Dry and intact without significant lesions or rashes Neuro: Alert and  oriented x4;  No focal deficits. Psych:  Cooperative. Normal mood and affect.    Doree Albee, PA-C 7:35 AM

## 2023-10-13 ENCOUNTER — Ambulatory Visit: Payer: Medicare HMO | Admitting: Cardiovascular Disease

## 2023-10-29 ENCOUNTER — Ambulatory Visit: Payer: Medicare HMO | Admitting: Cardiovascular Disease

## 2023-11-09 NOTE — Progress Notes (Deleted)
 11/09/2023 Justin [name] [id number] April 17, 1956  Referring provider: Marcine Matar, MD Primary GI doctor: Dr. Rhea Belton  ASSESSMENT AND PLAN:   Assessment and Plan          Personal history of polyps and family history of colon cancer in his father Colonoscopy 2017 recall 5 years  PAF On Eliquis Echo 2023 normal ejection fraction and valves unremarkable  Type 2 diabetes on glipizide, Jardiance  COPD history    Patient Care Team: Marcine Matar, MD as PCP - General (Internal Medicine) Runell Gess, MD as PCP - Cardiology (Cardiology) Ranelle Oyster, MD as Consulting Physician (Physical Medicine and Rehabilitation)  HISTORY OF PRESENT ILLNESS: 68 y.o. male with a past medical history of hypertension, dyslipidemia, obesity, tobacco use (03/2022 echocardiogram ejection fraction 55 to 60% unremarkable valves), type 2 diabetes, PAF COPD and others listed below presents for evaluation of ***.   10/16/2015 colonoscopy for family history father with colon cancer sessile polyp rectum recall 5 years' Patient was scheduled for colonoscopy 12/2021 but no showed for the appointment.  Discussed the use of AI scribe software for clinical note transcription with the patient, who gave verbal consent to proceed.  History of Present Illness            He  reports that he has quit smoking. His smoking use included cigarettes. He has never used smokeless tobacco. He reports current alcohol use of about 2.0 - 3.0 standard drinks of alcohol per week. He reports that he does not currently use drugs.  RELEVANT GI HISTORY, IMAGING AND LABS: Results          CBC    Component Value Date/Time   WBC 6.0 03/02/2023 1636   WBC 8.0 01/22/2022 1748   RBC 4.65 03/02/2023 1636   RBC 5.02 01/22/2022 1748   HGB 13.9 03/02/2023 1636   HCT 42.3 03/02/2023 1636   PLT 212 03/02/2023 1636   MCV 91 03/02/2023 1636   MCH 29.9 03/02/2023 1636   MCH 29.9 01/22/2022 1748   MCHC 32.9  03/02/2023 1636   MCHC 33.7 01/22/2022 1748   RDW 13.5 03/02/2023 1636   LYMPHSABS 4.3 (H) 01/22/2022 1748   LYMPHSABS 3.6 (H) 02/18/2017 1115   MONOABS 0.8 01/22/2022 1748   EOSABS 0.1 01/22/2022 1748   EOSABS 0.1 02/18/2017 1115   BASOSABS 0.1 01/22/2022 1748   BASOSABS 0.1 02/18/2017 1115   Recent Labs    03/02/23 1636  HGB 13.9    CMP     Component Value Date/Time   NA 143 03/02/2023 1636   K 4.2 03/02/2023 1636   CL 108 (H) 03/02/2023 1636   CO2 21 03/02/2023 1636   GLUCOSE 104 (H) 03/02/2023 1636   GLUCOSE 187 (H) 01/22/2022 1748   BUN 15 03/02/2023 1636   CREATININE 1.08 03/02/2023 1636   CREATININE 1.20 03/03/2016 1521   CALCIUM 9.0 03/02/2023 1636   PROT 6.5 03/02/2023 1636   ALBUMIN 4.2 03/02/2023 1636   AST 21 03/02/2023 1636   ALT 23 03/02/2023 1636   ALKPHOS 76 03/02/2023 1636   BILITOT 0.3 03/02/2023 1636   GFRNONAA >60 01/22/2022 1748   GFRNONAA 64 03/03/2016 1521   GFRAA 81 03/08/2020 1122   GFRAA 74 03/03/2016 1521      Latest Ref Rng & Units 03/02/2023    4:36 PM 04/09/2022   10:12 AM 01/22/2022    5:48 PM  Hepatic Function  Total Protein 6.0 - 8.5 g/dL 6.5  6.5  5.8   Albumin 3.9 - 4.9 g/dL 4.2  4.3  3.2   AST 0 - 40 IU/L 21  16  23    ALT 0 - 44 IU/L 23  20  64   Alk Phosphatase 44 - 121 IU/L 76  95  85   Total Bilirubin 0.0 - 1.2 mg/dL 0.3  <7.8  0.8   Bilirubin, Direct 0.00 - 0.40 mg/dL  <2.95        Current Medications:   Current Outpatient Medications (Endocrine & Metabolic):    glipiZIDE (GLUCOTROL) 10 MG tablet, Take 1 tablet (10 mg total) by mouth daily before breakfast.   JARDIANCE 10 MG TABS tablet, Take 1 tablet (10 mg total) by mouth daily.  Current Outpatient Medications (Cardiovascular):    atorvastatin (LIPITOR) 40 MG tablet, Take 1 tablet (40 mg total) by mouth daily.   ezetimibe (ZETIA) 10 MG tablet, Take 1 tablet (10 mg total) by mouth daily.   metoprolol succinate (TOPROL-XL) 25 MG 24 hr tablet, Take 0.5 tablets (12.5  mg total) by mouth daily.    Current Outpatient Medications (Hematological):    apixaban (ELIQUIS) 5 MG TABS tablet, Take 1 tablet (5 mg total) by mouth 2 (two) times daily.  Current Outpatient Medications (Other):    ACCU-CHEK GUIDE test strip, USE AS DIRECTED TO CHECK BLOOD SUGAR DAILY. E11.69 (Patient not taking: Reported on 03/30/2023)   Accu-Chek Softclix Lancets lancets, CHECK BLOOD SUGAR ONE TIME A DAY. E11.69 (Patient not taking: Reported on 03/30/2023)   Blood Glucose Monitoring Suppl (ACCU-CHEK GUIDE) w/Device KIT, CHECK BLOOD SUGAR ONE TIME A DAY. E11.69 (Patient not taking: Reported on 03/30/2023)   Continuous Glucose Receiver (FREESTYLE LIBRE READER) DEVI, Use to check blood sugars daily. (Patient not taking: Reported on 03/30/2023)   Continuous Glucose Sensor (FREESTYLE LIBRE SENSOR SYSTEM) MISC, Change sensor every 2 weeks (Patient not taking: Reported on 03/30/2023)   tamsulosin (FLOMAX) 0.4 MG CAPS capsule, Take 1 capsule (0.4 mg total) by mouth daily.  Medical History:  Past Medical History:  Diagnosis Date   A-fib [facility])    Arthritis    states he was given med. for inflammation in his "bones" but he isn't taking it right now, remarks " i'm not going to to take something they just throw at me for no reason"   Diabetes mellitus (HCC)    Dyspnea    pt. reports this SOB worsens wioth increased heat    Former smoker    Hypercholesteremia    Hypertension    Obesity    Allergies: No Known Allergies   Surgical History:  He  has a past surgical history that includes Tonsillectomy; Umbilical hernia repair; Back surgery (06/24/2004); and Multiple extractions with alveoloplasty (N/A, 07/03/2017). Family History:  His family history includes Colon cancer in his father; Heart disease in his father and mother.  REVIEW OF SYSTEMS  : All other systems reviewed and negative except where noted in the History of Present Illness.  PHYSICAL EXAM: There were no vitals taken for this  visit. Physical Exam          Doree Albee, PA-C 1:11 PM

## 2023-11-10 ENCOUNTER — Ambulatory Visit: Payer: Medicare Other | Admitting: Physician Assistant

## 2023-11-16 ENCOUNTER — Ambulatory Visit: Attending: Cardiovascular Disease | Admitting: Cardiovascular Disease

## 2023-11-17 ENCOUNTER — Encounter: Payer: Self-pay | Admitting: Cardiovascular Disease

## 2024-01-07 LAB — HM DIABETES EYE EXAM

## 2024-05-10 ENCOUNTER — Encounter: Payer: Self-pay | Admitting: Nurse Practitioner

## 2024-05-19 ENCOUNTER — Encounter: Payer: Self-pay | Admitting: Physician Assistant

## 2024-06-28 ENCOUNTER — Ambulatory Visit: Payer: Medicare PPO | Attending: Internal Medicine

## 2024-06-28 VITALS — Ht 71.0 in

## 2024-06-28 DIAGNOSIS — Z0000 Encounter for general adult medical examination without abnormal findings: Secondary | ICD-10-CM

## 2024-06-28 NOTE — Progress Notes (Cosign Needed Addendum)
 Subjective:   Justin [name] is a 68 y.o. male who presents for a Medicare Annual Wellness Visit.  Allergies (verified) Patient has no known allergies.   History: Past Medical History:  Diagnosis Date   A-fib [facility])    Arthritis    states he was given med. for inflammation in his bones but he isn't taking it right now, remarks  i'm not going to to take something they just throw at me for no reason   Diabetes mellitus (HCC)    Dyspnea    pt. reports this SOB worsens wioth increased heat    Former smoker    Hypercholesteremia    Hypertension    Obesity    Past Surgical History:  Procedure Laterality Date   BACK SURGERY  06/24/2004   [facility]-    MULTIPLE EXTRACTIONS WITH ALVEOLOPLASTY N/A 07/03/2017   Procedure: MULTIPLE EXTRACTION WITH ALVEOLOPLASTY;  Surgeon: Sheryle Hamilton, DDS;  Location: [facility] OR;  Service: Oral Surgery;  Laterality: N/A;   TONSILLECTOMY     UMBILICAL HERNIA REPAIR     Family History  Problem Relation Age of Onset   Heart disease Mother    Colon cancer Father    Heart disease Father    Esophageal cancer Neg Hx    Stomach cancer Neg Hx    Rectal cancer Neg Hx    Social History   Occupational History   Not on file  Tobacco Use   Smoking status: Former    Current packs/day: 0.25    Types: Cigarettes   Smokeless tobacco: Never  Vaping Use   Vaping status: Never Used  Substance and Sexual Activity   Alcohol use: Yes    Alcohol/week: 2.0 - 3.0 standard drinks of alcohol    Types: 2 - 3 Cans of beer per week    Comment: occasional   Drug use: Not Currently   Sexual activity: Not on file   Tobacco Counseling Counseling given: Not Answered  SDOH Screenings   Food Insecurity: No Food Insecurity (06/28/2024)  Housing: Low Risk  (06/28/2024)  Transportation Needs: No Transportation Needs (06/28/2024)  Utilities: Not At Risk (06/28/2024)  Alcohol Screen: Low Risk  (06/23/2023)  Depression (PHQ2-9): Low Risk  (06/28/2024)  Financial Resource Strain:  Low Risk  (06/23/2023)  Physical Activity: Sufficiently Active (06/28/2024)  Social Connections: Moderately Integrated (06/28/2024)  Stress: No Stress Concern Present (06/28/2024)  Tobacco Use: Medium Risk (06/28/2024)  Health Literacy: Adequate Health Literacy (06/28/2024)   Depression Screen    06/28/2024    9:19 AM 06/23/2023    9:31 AM 03/30/2023    2:12 PM 03/02/2023    4:22 PM 06/21/2020    2:15 PM 03/08/2020   11:35 AM 03/08/2020    9:52 AM  PHQ 2/9 Scores  PHQ - 2 Score 0 0 1 1 0 0 0  PHQ- 9 Score 0 0 1 6        Goals Addressed             This Visit's Progress    06/28/2024: To stay motivated, active and join the gym.         Visit info / Clinical Intake: Medicare Wellness Visit Type:: Subsequent Annual Wellness Visit Medicare Wellness Visit Mode:: Telephone If telephone:: video declined If telephone or video:: pt reported vitals Interpreter Needed?: No Pre-visit prep was completed: yes AWV questionnaire completed by patient prior to visit?: no Living arrangements:: (!) lives alone Patient's Overall Health Status Rating: (!) fair Typical amount of pain: none  Does pain affect daily life?: no Are you currently prescribed opioids?: no  Dietary Habits and Nutritional Risks How many meals a day?: 2 Eats fruit and vegetables daily?: yes Most meals are obtained by: preparing own meals Diabetic:: (!) yes Any non-healing wounds?: no How often do you check your BS?: 0 Would you like to be referred to a Nutritionist or for Diabetic Management? : no  Functional Status Activities of Daily Living (to include ambulation/medication): Independent Ambulation: Independent with device- listed below Home Assistive Devices/Equipment: Cane; Elevated toliet seat; Eyeglasses; Shower/tub chair; Communication device (specify type); Other (Comment) (LIFE ALERT) Medication Administration: Independent Home Management: Independent Manage your own finances?: yes Primary transportation is:  driving Concerns about vision?: no *vision screening is required for WTM* Concerns about hearing?: no  Fall Screening Falls in the past year?: 0 Number of falls in past year: 0 Was there an injury with Fall?: 0 Fall Risk Category Calculator: 0 Patient Fall Risk Level: Low Fall Risk  Fall Risk Patient at Risk for Falls Due to: No Fall Risks Fall risk Follow up: Falls evaluation completed; Education provided  Home and Transportation Safety: All rugs have non-skid backing?: N/A, no rugs All stairs or steps have railings?: N/A, no stairs Grab bars in the bathtub or shower?: yes Have non-skid surface in bathtub or shower?: yes Good home lighting?: yes Regular seat belt use?: yes Hospital stays in the last year:: no  Cognitive Assessment Difficulty concentrating, remembering, or making decisions? : no Will 6CIT or Mini Cog be Completed: no 6CIT or Mini Cog Declined: patient alert, oriented, able to answer questions appropriately and recall recent events  Advance Directives (For Healthcare) Does Patient Have a Medical Advance Directive?: No Would patient like information on creating a medical advance directive?: No - Patient declined        Objective:    Today's Vitals   06/28/24 0915  Height: 5' 11 (1.803 m)  PainSc: 0-No pain   Body mass index is 39.05 kg/m.  Current Medications (verified) Outpatient Encounter Medications as of 06/28/2024  Medication Sig   ACCU-CHEK GUIDE test strip USE AS DIRECTED TO CHECK BLOOD SUGAR DAILY. E11.69 (Patient not taking: Reported on 03/30/2023)   Accu-Chek Softclix Lancets lancets CHECK BLOOD SUGAR ONE TIME A DAY. E11.69 (Patient not taking: Reported on 03/30/2023)   apixaban  (ELIQUIS ) 5 MG TABS tablet Take 1 tablet (5 mg total) by mouth 2 (two) times daily.   atorvastatin  (LIPITOR) 40 MG tablet Take 1 tablet (40 mg total) by mouth daily.   Blood Glucose Monitoring Suppl (ACCU-CHEK GUIDE) w/Device KIT CHECK BLOOD SUGAR ONE TIME A DAY.  E11.69 (Patient not taking: Reported on 03/30/2023)   Continuous Glucose Receiver (FREESTYLE LIBRE READER) DEVI Use to check blood sugars daily. (Patient not taking: Reported on 03/30/2023)   Continuous Glucose Sensor (FREESTYLE LIBRE SENSOR SYSTEM) MISC Change sensor every 2 weeks (Patient not taking: Reported on 03/30/2023)   ezetimibe  (ZETIA ) 10 MG tablet Take 1 tablet (10 mg total) by mouth daily.   glipiZIDE  (GLUCOTROL ) 10 MG tablet Take 1 tablet (10 mg total) by mouth daily before breakfast.   JARDIANCE  10 MG TABS tablet Take 1 tablet (10 mg total) by mouth daily.   metoprolol  succinate (TOPROL -XL) 25 MG 24 hr tablet Take 0.5 tablets (12.5 mg total) by mouth daily.   tamsulosin  (FLOMAX ) 0.4 MG CAPS capsule Take 1 capsule (0.4 mg total) by mouth daily.   No facility-administered encounter medications on file as of 06/28/2024.   Hearing/Vision screen  Hearing Screening - Comments:: Denies hearing difficulties.  Vision Screening - Comments:: Wears rx glasses - up to date with routine eye exams with Charlie Glendia Gaudy, MD.  Immunizations and Health Maintenance Health Maintenance  Topic Date Due   COVID-19 Vaccine (1) Never done   DTaP/Tdap/Td (1 - Tdap) Never done   Pneumococcal Vaccine: 50+ Years (1 of 2 - PCV) Never done   Zoster Vaccines- Shingrix (1 of 2) Never done   Colonoscopy  10/15/2020   HEMOGLOBIN A1C  09/02/2023   Diabetic kidney evaluation - eGFR measurement  03/01/2024   Diabetic kidney evaluation - Urine ACR  03/01/2024   FOOT EXAM  03/01/2024   Influenza Vaccine  Never done   OPHTHALMOLOGY EXAM  01/06/2025   Medicare Annual Wellness (AWV)  06/28/2025   Hepatitis C Screening  Completed   Meningococcal B Vaccine  Aged Out        Assessment/Plan:  This is a routine wellness examination for Bj's.  Patient Care Team: Vicci Barnie NOVAK, MD as PCP - General (Internal Medicine) Court Dorn PARAS, MD as PCP - Cardiology (Cardiology) Babs Arthea DASEN, MD as Consulting  Physician (Physical Medicine and Rehabilitation)  I have personally reviewed and noted the following in the patient's chart:   Medical and social history Use of alcohol, tobacco or illicit drugs  Current medications and supplements including opioid prescriptions. Functional ability and status Nutritional status Physical activity Advanced directives List of other physicians Hospitalizations, surgeries, and ER visits in previous 12 months Vitals Screenings to include cognitive, depression, and falls Referrals and appointments  No orders of the defined types were placed in this encounter.  In addition, I have reviewed and discussed with patient certain preventive protocols, quality metrics, and best practice recommendations. A written personalized care plan for preventive services as well as general preventive health recommendations were provided to patient.   Roz LOISE Fuller, LPN   88/12/7972   Return in about 1 year (around 06/28/2025) for Medicare wellness.  After Visit Summary: (MyChart) Due to this being a telephonic visit, the after visit summary with patients personalized plan was offered to patient via MyChart   Nurse Notes: None at this time.

## 2024-06-28 NOTE — Patient Instructions (Signed)
 Justin [name],  Thank you for taking the time for your Medicare Wellness Visit. I appreciate your continued commitment to your health goals. Please review the care plan we discussed, and feel free to reach out if I can assist you further.  Please note that Annual Wellness Visits do not include a physical exam. Some assessments may be limited, especially if the visit was conducted virtually. If needed, we may recommend an in-person follow-up with your provider.  Ongoing Care Seeing your primary care provider every 3 to 6 months helps us  monitor your health and provide consistent, personalized care.   Referrals If a referral was made during today's visit and you haven't received any updates within two weeks, please contact the referred provider directly to check on the status.  Recommended Screenings:  Health Maintenance  Topic Date Due   COVID-19 Vaccine (1) Never done   DTaP/Tdap/Td vaccine (1 - Tdap) Never done   Pneumococcal Vaccine for age over 37 (1 of 2 - PCV) Never done   Zoster (Shingles) Vaccine (1 of 2) Never done   Colon Cancer Screening  10/15/2020   Hemoglobin A1C  09/02/2023   Yearly kidney function blood test for diabetes  03/01/2024   Yearly kidney health urinalysis for diabetes  03/01/2024   Complete foot exam   03/01/2024   Flu Shot  Never done   Eye exam for diabetics  01/06/2025   Medicare Annual Wellness Visit  06/28/2025   Hepatitis C Screening  Completed   Meningitis B Vaccine  Aged Out       06/28/2024    9:21 AM  Advanced Directives  Does Patient Have a Medical Advance Directive? No  Would patient like information on creating a medical advance directive? No - Patient declined    Vision: Annual vision screenings are recommended for early detection of glaucoma, cataracts, and diabetic retinopathy. These exams can also reveal signs of chronic conditions such as diabetes and high blood pressure.  Dental: Annual dental screenings help detect early signs of  oral cancer, gum disease, and other conditions linked to overall health, including heart disease and diabetes.  Please see the attached documents for additional preventive care recommendations.

## 2024-07-11 NOTE — Progress Notes (Deleted)
 Ellouise Console, PA-C [address] [place], [place]  [address] Phone: [phone]   Gastroenterology Consultation  Referring Provider:     Vicci Barnie NOVAK, MD Primary Care Physician:  Vicci Barnie NOVAK, MD Primary Gastroenterologist:  Ellouise Console, PA-C / Dr. Gordy Starch  Reason for Consultation:     Discuss repeat colonoscopy        HPI:   Discussed the use of AI scribe software for clinical note transcription with the patient, who gave verbal consent to proceed.  09/2015 last colonoscopy in [facility] by Dr. Starch: 5 mm sessile tubular adenoma rectal polyp removed.  Good prep.  5-year repeat due to family history of colon cancer in his father. History of Present Illness   PMH: Paroxysmal atrial fibrillation, hypertension, type 2 diabetes, OA, tobacco use disorder.  Currently on Eliquis .  03/2022 colonoscopy LVEF 55 to 60%.  Past Medical History:  Diagnosis Date   A-fib [facility])    Arthritis    states he was given med. for inflammation in his bones but he isn't taking it right now, remarks  i'm not going to to take something they just throw at me for no reason   Diabetes mellitus (HCC)    Dyspnea    pt. reports this SOB worsens wioth increased heat    Former smoker    Hypercholesteremia    Hypertension    Obesity     Past Surgical History:  Procedure Laterality Date   BACK SURGERY  06/24/2004   [facility]-    MULTIPLE EXTRACTIONS WITH ALVEOLOPLASTY N/A 07/03/2017   Procedure: MULTIPLE EXTRACTION WITH ALVEOLOPLASTY;  Surgeon: Sheryle Hamilton, DDS;  Location: [facility] OR;  Service: Oral Surgery;  Laterality: N/A;   TONSILLECTOMY     UMBILICAL HERNIA REPAIR      Prior to Admission medications   Medication Sig Start Date End Date Taking? Authorizing Provider  ACCU-CHEK GUIDE test strip USE AS DIRECTED TO CHECK BLOOD SUGAR DAILY. E11.69 Patient not taking: Reported on 03/30/2023 03/02/23   Vicci Barnie NOVAK, MD  Accu-Chek Softclix Lancets lancets CHECK BLOOD SUGAR ONE TIME A DAY.  E11.69 Patient not taking: Reported on 03/30/2023 03/02/23   Vicci Barnie NOVAK, MD  apixaban  (ELIQUIS ) 5 MG TABS tablet Take 1 tablet (5 mg total) by mouth 2 (two) times daily. 03/30/23 10/26/23  Vicci Barnie NOVAK, MD  atorvastatin  (LIPITOR) 40 MG tablet Take 1 tablet (40 mg total) by mouth daily. 03/30/23   Vicci Barnie NOVAK, MD  Blood Glucose Monitoring Suppl (ACCU-CHEK GUIDE) w/Device KIT CHECK BLOOD SUGAR ONE TIME A DAY. E11.69 Patient not taking: Reported on 03/30/2023 03/02/23   Vicci Barnie NOVAK, MD  Continuous Glucose Receiver (FREESTYLE LIBRE READER) DEVI Use to check blood sugars daily. Patient not taking: Reported on 03/30/2023 03/02/23   Vicci Barnie NOVAK, MD  Continuous Glucose Sensor (FREESTYLE LIBRE SENSOR SYSTEM) MISC Change sensor every 2 weeks Patient not taking: Reported on 03/30/2023 03/02/23   Vicci Barnie NOVAK, MD  ezetimibe  (ZETIA ) 10 MG tablet Take 1 tablet (10 mg total) by mouth daily. 03/30/23   Vicci Barnie NOVAK, MD  glipiZIDE  (GLUCOTROL ) 10 MG tablet Take 1 tablet (10 mg total) by mouth daily before breakfast. 03/30/23   Vicci Barnie NOVAK, MD  JARDIANCE  10 MG TABS tablet Take 1 tablet (10 mg total) by mouth daily. 03/30/23   Vicci Barnie NOVAK, MD  metoprolol  succinate (TOPROL -XL) 25 MG 24 hr tablet Take 0.5 tablets (12.5 mg total) by mouth daily. 03/30/23  Vicci Barnie NOVAK, MD  tamsulosin  (FLOMAX ) 0.4 MG CAPS capsule Take 1 capsule (0.4 mg total) by mouth daily. 03/30/23   Vicci Barnie NOVAK, MD    Family History  Problem Relation Age of Onset   Heart disease Mother    Colon cancer Father    Heart disease Father    Esophageal cancer Neg Hx    Stomach cancer Neg Hx    Rectal cancer Neg Hx      Social History   Tobacco Use   Smoking status: Former    Current packs/day: 0.25    Types: Cigarettes   Smokeless tobacco: Never  Vaping Use   Vaping status: Never Used  Substance Use Topics   Alcohol use: Yes    Alcohol/week: 2.0 - 3.0 standard drinks of alcohol    Types: 2 - 3  Cans of beer per week    Comment: occasional   Drug use: Not Currently    Allergies as of 07/12/2024   (No Known Allergies)    Review of Systems:    All systems reviewed and negative except where noted in HPI.   Physical Exam:  There were no vitals taken for this visit. No LMP for male patient.  General:   Alert,  Well-developed, well-nourished, pleasant and cooperative in NAD Lungs:  Respirations even and unlabored.  Clear throughout to auscultation.   No wheezes, crackles, or rhonchi. No acute distress. Heart:  Regular rate and rhythm; no murmurs, clicks, rubs, or gallops. Abdomen:  Normal bowel sounds.  No bruits.  Soft, and non-distended without masses, hepatosplenomegaly or hernias noted.  No Tenderness.  No guarding or rebound tenderness.    Neurologic:  Alert and oriented x3;  grossly normal neurologically. Psych:  Alert and cooperative. Normal mood and affect.   Imaging Studies: No results found.  Labs: CBC    Component Value Date/Time   WBC 6.0 03/02/2023 1636   WBC 8.0 01/22/2022 1748   RBC 4.65 03/02/2023 1636   RBC 5.02 01/22/2022 1748   HGB 13.9 03/02/2023 1636   HCT 42.3 03/02/2023 1636   PLT 212 03/02/2023 1636   MCV 91 03/02/2023 1636    CMP     Component Value Date/Time   NA 143 03/02/2023 1636   K 4.2 03/02/2023 1636   CL 108 (H) 03/02/2023 1636   CO2 21 03/02/2023 1636   GLUCOSE 104 (H) 03/02/2023 1636   GLUCOSE 187 (H) 01/22/2022 1748   BUN 15 03/02/2023 1636   CREATININE 1.08 03/02/2023 1636   CREATININE 1.20 03/03/2016 1521   CALCIUM  9.0 03/02/2023 1636   PROT 6.5 03/02/2023 1636   ALBUMIN 4.2 03/02/2023 1636   AST 21 03/02/2023 1636   ALT 23 03/02/2023 1636   ALKPHOS 76 03/02/2023 1636   BILITOT 0.3 03/02/2023 1636   GFRNONAA >60 01/22/2022 1748   GFRNONAA 64 03/03/2016 1521   GFRAA 81 03/08/2020 1122   GFRAA 74 03/03/2016 1521    Assessment and Plan:   Justin [name] is a 68 y.o. y/o male has been referred for   1.  History  of adenomatous colon polyps - Scheduling Colonoscopy I discussed risks of colonoscopy with patient to include risk of bleeding, colon perforation, and risk of sedation.  Patient expressed understanding and agrees to proceed with colonoscopy.   2.  Family history of colon cancer in first-degree relative (father)  3.  History of A-fib and CVA, currently on Eliquis  - Request permission to hold Eliquis  2 days prior to colonoscopy  Assessment and Plan Assessment & Plan       Follow up ***  Ellouise Console, PA-C

## 2024-07-12 ENCOUNTER — Ambulatory Visit: Admitting: Physician Assistant

## 2024-08-30 ENCOUNTER — Ambulatory Visit: Admitting: Physician Assistant

## 2024-08-30 ENCOUNTER — Encounter: Payer: Self-pay | Admitting: Physician Assistant

## 2024-08-30 ENCOUNTER — Telehealth: Payer: Self-pay

## 2024-08-30 VITALS — BP 132/70 | HR 76 | Ht 71.0 in | Wt 270.1 lb

## 2024-08-30 DIAGNOSIS — Z860101 Personal history of adenomatous and serrated colon polyps: Secondary | ICD-10-CM

## 2024-08-30 DIAGNOSIS — Z7901 Long term (current) use of anticoagulants: Secondary | ICD-10-CM

## 2024-08-30 DIAGNOSIS — I480 Paroxysmal atrial fibrillation: Secondary | ICD-10-CM

## 2024-08-30 DIAGNOSIS — Z09 Encounter for follow-up examination after completed treatment for conditions other than malignant neoplasm: Secondary | ICD-10-CM

## 2024-08-30 DIAGNOSIS — Z860100 Personal history of colon polyps, unspecified: Secondary | ICD-10-CM

## 2024-08-30 MED ORDER — NA SULFATE-K SULFATE-MG SULF 17.5-3.13-1.6 GM/177ML PO SOLN: 1.0000 | Freq: Once | ORAL | 0 refills | Status: AC

## 2024-08-30 NOTE — Telephone Encounter (Signed)
 [facility] Pre-operative Risk Assessment     Request for surgical clearance:     Endoscopy Procedure  What type of surgery is being performed?     Colonoscopy  When is this surgery scheduled?     09/28/24  What type of clearance is required ?   Pharmacy  Are there any medications that need to be held prior to surgery and how long? Eliquis  2 days  Practice name and name of physician performing surgery?      [facility]  What is your office phone and fax number?      Phone- [phone]  Fax- [phone]  Anesthesia type (None, local, MAC, general) ?       MAC   Please route your response to Alethea Blocker, CMA

## 2024-08-30 NOTE — Telephone Encounter (Signed)
"  ° °  Patient Name: Justin [name]  DOB: February 27, 1956 MRN: [id number]  Primary Cardiologist: Dorn Lesches, MD  Chart reviewed as part of pre-operative protocol coverage.  Patient has not been seen in cardiology office since 09/2022.  His Eliquis  has been prescribed by his PCP, recommendations regarding holding of Eliquis  will need to come from prescribing office.  Alysiah Suppa D Edmon Magid, NP 08/30/2024, 2:38 PM   "

## 2024-08-30 NOTE — Progress Notes (Signed)
 "     Ellouise Console, PA-C [address] [place], [place]  [address] Phone: [phone]   Gastroenterology Consultation  Referring Provider:     Vicci Barnie NOVAK, MD Primary Care Physician:  Vicci Barnie NOVAK, MD Primary Gastroenterologist:  Ellouise Console, PA-C / Dr. Gordy Starch  Reason for Consultation:     Discuss repeat colonoscopy; history of adenomatous colon polyp        HPI:   Discussed the use of AI scribe software for clinical note transcription with the patient, who gave verbal consent to proceed.  AZAAN LEASK is a 69 year old male with type 2 diabetes, atrial fibrillation on Eliquis , and prior colonic polyp who presents for pre-colonoscopy evaluation and medication review.  09/2015 last colonoscopy in [facility] by Dr. Starch: 1 small 5 mm tubular adenoma rectal polyp removed.  Good prep with Suprep.  Internal hemorrhoids.  5-year repeat. History of Present Illness Colorectal cancer screening and gastrointestinal symptoms - First colonoscopy in 2017 revealed a single small tubular adenoma polyp; no repeat colonoscopy since. - Recent attempt to schedule colonoscopy was canceled due to Eliquis  use. - No gastrointestinal symptoms including blood in stool, diarrhea, constipation, abdominal pain, heartburn, or dysphagia. - Certain foods such as spaghetti cause reflux symptoms, so he avoids them at night and no longer uses ketchup.  Anticoagulation management - Takes Eliquis  twice daily for atrial fibrillation. - Withheld morning dose of Eliquis  today due to uncertainty about procedure timing. - Brought all current medications to the visit. - Denies chest pain, shortness of breath, heart palpitations, or any other cardiac symptoms.  Functional status - Remains active with regular walking, house cleaning, and community involvement.  PMH: A-fib, diabetes, hypertension, hyperlipidemia, currently on Eliquis .  Cardiologist Dr. Dorn Lesches.  03/2022 LVEF 55 to 60%.  Past Medical  History:  Diagnosis Date   A-fib [facility])    Arthritis    states he was given med. for inflammation in his bones but he isn't taking it right now, remarks  i'm not going to to take something they just throw at me for no reason   Diabetes mellitus (HCC)    Dyspnea    pt. reports this SOB worsens wioth increased heat    Former smoker    Hypercholesteremia    Hypertension    Obesity     Past Surgical History:  Procedure Laterality Date   BACK SURGERY  06/24/2004   [facility]-    MULTIPLE EXTRACTIONS WITH ALVEOLOPLASTY N/A 07/03/2017   Procedure: MULTIPLE EXTRACTION WITH ALVEOLOPLASTY;  Surgeon: Sheryle Hamilton, DDS;  Location: [facility] OR;  Service: Oral Surgery;  Laterality: N/A;   TONSILLECTOMY     UMBILICAL HERNIA REPAIR      Prior to Admission medications  Medication Sig Start Date End Date Taking? Authorizing Provider  ACCU-CHEK GUIDE test strip USE AS DIRECTED TO CHECK BLOOD SUGAR DAILY. E11.69 Patient not taking: Reported on 03/30/2023 03/02/23   Vicci Barnie NOVAK, MD  Accu-Chek Softclix Lancets lancets CHECK BLOOD SUGAR ONE TIME A DAY. E11.69 Patient not taking: Reported on 03/30/2023 03/02/23   Vicci Barnie NOVAK, MD  apixaban  (ELIQUIS ) 5 MG TABS tablet Take 1 tablet (5 mg total) by mouth 2 (two) times daily. 03/30/23 10/26/23  Vicci Barnie NOVAK, MD  atorvastatin  (LIPITOR) 40 MG tablet Take 1 tablet (40 mg total) by mouth daily. 03/30/23   Vicci Barnie NOVAK, MD  Blood Glucose Monitoring Suppl (ACCU-CHEK GUIDE) w/Device KIT CHECK BLOOD SUGAR ONE TIME A DAY. E11.69 Patient  not taking: Reported on 03/30/2023 03/02/23   Vicci Barnie NOVAK, MD  Continuous Glucose Receiver (FREESTYLE LIBRE READER) DEVI Use to check blood sugars daily. Patient not taking: Reported on 03/30/2023 03/02/23   Vicci Barnie NOVAK, MD  Continuous Glucose Sensor (FREESTYLE LIBRE SENSOR SYSTEM) MISC Change sensor every 2 weeks Patient not taking: Reported on 03/30/2023 03/02/23   Vicci Barnie NOVAK, MD  ezetimibe  (ZETIA ) 10 MG tablet Take 1  tablet (10 mg total) by mouth daily. 03/30/23   Vicci Barnie NOVAK, MD  glipiZIDE  (GLUCOTROL ) 10 MG tablet Take 1 tablet (10 mg total) by mouth daily before breakfast. 03/30/23   Vicci Barnie NOVAK, MD  JARDIANCE  10 MG TABS tablet Take 1 tablet (10 mg total) by mouth daily. 03/30/23   Vicci Barnie NOVAK, MD  metoprolol  succinate (TOPROL -XL) 25 MG 24 hr tablet Take 0.5 tablets (12.5 mg total) by mouth daily. 03/30/23   Vicci Barnie NOVAK, MD  tamsulosin  (FLOMAX ) 0.4 MG CAPS capsule Take 1 capsule (0.4 mg total) by mouth daily. 03/30/23   Vicci Barnie NOVAK, MD    Family History  Problem Relation Age of Onset   Heart disease Mother    Colon cancer Father    Heart disease Father    Esophageal cancer Neg Hx    Stomach cancer Neg Hx    Rectal cancer Neg Hx      Social History[1]  Allergies as of 08/30/2024   (No Known Allergies)    Review of Systems:    All systems reviewed and negative except where noted in HPI.   Physical Exam:  BP 132/70   Pulse 76   Ht 5' 11 (1.803 m)   Wt 270 lb 2 oz (122.5 kg)   BMI 37.67 kg/m  No LMP for male patient.  General:   Alert,  Well-developed, well-nourished, pleasant and cooperative in NAD Lungs:  Respirations even and unlabored.  Clear throughout to auscultation.   No wheezes, crackles, or rhonchi. No acute distress. Heart:  Regular rate and rhythm; no murmurs, clicks, rubs, or gallops.  Currently in normal sinus rhythm. Abdomen:  Normal bowel sounds.  No bruits.  Soft, and non-distended without masses, hepatosplenomegaly or hernias noted.  No Tenderness.  No guarding or rebound tenderness.    Neurologic:  Alert and oriented x3;  grossly normal neurologically. Psych:  Alert and cooperative. Normal mood and affect.   Imaging Studies: No results found.  Labs: CBC    Component Value Date/Time   WBC 6.0 03/02/2023 1636   WBC 8.0 01/22/2022 1748   RBC 4.65 03/02/2023 1636   RBC 5.02 01/22/2022 1748   HGB 13.9 03/02/2023 1636   HCT 42.3  03/02/2023 1636   PLT 212 03/02/2023 1636   MCV 91 03/02/2023 1636    CMP     Component Value Date/Time   NA 143 03/02/2023 1636   K 4.2 03/02/2023 1636   CL 108 (H) 03/02/2023 1636   CO2 21 03/02/2023 1636   GLUCOSE 104 (H) 03/02/2023 1636   GLUCOSE 187 (H) 01/22/2022 1748   BUN 15 03/02/2023 1636   CREATININE 1.08 03/02/2023 1636   CREATININE 1.20 03/03/2016 1521   CALCIUM  9.0 03/02/2023 1636   PROT 6.5 03/02/2023 1636   ALBUMIN 4.2 03/02/2023 1636   AST 21 03/02/2023 1636   ALT 23 03/02/2023 1636   ALKPHOS 76 03/02/2023 1636   BILITOT 0.3 03/02/2023 1636   GFRNONAA >60 01/22/2022 1748   GFRNONAA 64 03/03/2016 1521   GFRAA 81 03/08/2020 1122  GFRAA 74 03/03/2016 1521    Assessment and Plan:   DEMETRUS PAVAO is a 69 y.o. y/o male has been referred for:  1.  History of adenomatous colon polyp - Scheduling Colonoscopy I discussed risks of colonoscopy with patient to include risk of bleeding, colon perforation, and risk of sedation.  Patient expressed understanding and agrees to proceed with colonoscopy.   2.  Comorbidities:  A-fib, diabetes, hypertension, hyperlipidemia, currently on Eliquis .  Cardiologist Dr. Dorn Lesches.  03/2022 LVEF 55 to 60%. - Request cardiology permission to hold Eliquis  2 days prior to colonoscopy procedure    Follow up PRN based on Colonoscopy results and GI symptoms.  Ellouise Console, PA-C       [1]  Social History Tobacco Use   Smoking status: Former    Current packs/day: 0.25    Types: Cigarettes   Smokeless tobacco: Never  Vaping Use   Vaping status: Never Used  Substance Use Topics   Alcohol use: Yes    Alcohol/week: 2.0 - 3.0 standard drinks of alcohol    Types: 2 - 3 Cans of beer per week    Comment: occasional   Drug use: Not Currently   "

## 2024-08-30 NOTE — Patient Instructions (Signed)
 You have been scheduled for a Colonoscopy. Please follow written instructions given to you at your visit today.   If you use inhalers (even only as needed), please bring them with you on the day of your procedure.  DO NOT TAKE 7 DAYS PRIOR TO TEST- Trulicity (dulaglutide) Ozempic, Wegovy (semaglutide) Mounjaro (tirzepatide) Bydureon Bcise (exanatide extended release)  DO NOT TAKE 1 DAY PRIOR TO YOUR TEST Rybelsus (semaglutide) Adlyxin (lixisenatide) Victoza (liraglutide) Byetta (exanatide) ___________________________________________________________________________  Please follow up sooner if symptoms increase or worsen   Due to recent changes in healthcare laws, you may see the results of your imaging and laboratory studies on MyChart before your provider has had a chance to review them.  We understand that in some cases there may be results that are confusing or concerning to you. Not all laboratory results come back in the same time frame and the provider may be waiting for multiple results in order to interpret others.  Please give us  48 hours in order for your provider to thoroughly review all the results before contacting the office for clarification of your results.   Thank you for trusting me with your gastrointestinal care!   Ellouise Console, PA-C _______________________________________________________  If your blood pressure at your visit was 140/90 or greater, please contact your primary care physician to follow up on this.  _______________________________________________________  If you are age 12 or older, your body mass index should be between 23-30. Your Body mass index is 37.67 kg/m. If this is out of the aforementioned range listed, please consider follow up with your Primary Care Provider.  If you are age 34 or younger, your body mass index should be between 19-25. Your Body mass index is 37.67 kg/m. If this is out of the aformentioned range listed, please consider  follow up with your Primary Care Provider.   ________________________________________________________  The [facility] providers would like to encourage you to use MYCHART to communicate with providers for non-urgent requests or questions.  Due to long hold times on the telephone, sending your provider a message by [facility] may be a faster and more efficient way to get a response.  Please allow 48 business hours for a response.  Please remember that this is for non-urgent requests.  _______________________________________________________

## 2024-09-05 NOTE — Telephone Encounter (Signed)
" °  Justin [name] 25-Mar-1956 [id number]  09/05/24   Dear Vicci Barnie NOVAK, MD:  We have scheduled the above named patient for a(n) Endoscopy procedure. Our records show that he is on anticoagulation therapy.  Please advise as to whether the patient may come off their therapy of Eliquis  2 days prior to their procedure which is scheduled for 09/28/24.  Please route your response to Alethea Blocker, CMA or fax response to [phone].  Sincerely,    [facility]   "

## 2024-09-06 NOTE — Telephone Encounter (Signed)
 Please not that pt has a different PCP. So I can not give okay for him to hold his Eliquis . You would need to contact his PCP.

## 2024-09-06 NOTE — Telephone Encounter (Signed)
 Ms. Lanette: okay for pt to hold Eliquis  for 2-3 days prior to scope. Daniella: pt has not been seen since 2024. Please call him and schedule f/u with me or find out if he is receiving primary care else where.

## 2024-09-06 NOTE — Telephone Encounter (Signed)
 Copied from CRM #[id number]. Topic: General - Other >> Sep 06, 2024  9:46 AM   Jeoffrey H wrote:  Reason for CRM: Patient stated he a missed a call. Chart review shows he was needing a f/u appt with pcp. He stated he has not been back due to him having issues with the facility. He stated he is now seeing  Dr. Rojelio Daring [facility].

## 2024-09-06 NOTE — Telephone Encounter (Signed)
 Noted! Thank you

## 2024-09-09 NOTE — Telephone Encounter (Signed)
 Spoke to patient and let him know that per Dr. Vicci, he could hold his Eliquis  or 2 days prior to his procedure, starting Monday February 2.  Patient agreed

## 2024-09-09 NOTE — Telephone Encounter (Signed)
 I attempted to reach the patient to notify of Eliquis  hold.  I was unable to reach him.  Mailbox is full.  I will continue to try and reach the patient.  MyChart message sent as well.

## 2024-09-09 NOTE — Telephone Encounter (Signed)
 Patient returning call Requesting a call back  Please advise  Thank you

## 2024-09-20 ENCOUNTER — Encounter: Payer: Self-pay | Admitting: Internal Medicine

## 2024-09-26 ENCOUNTER — Telehealth: Payer: Self-pay | Admitting: Gastroenterology

## 2024-09-26 ENCOUNTER — Other Ambulatory Visit: Payer: Self-pay

## 2024-09-26 MED ORDER — NA SULFATE-K SULFATE-MG SULF 17.5-3.13-1.6 GM/177ML PO SOLN: 1.0000 | Freq: Once | ORAL | 0 refills | Status: AC

## 2024-09-26 NOTE — Telephone Encounter (Signed)
 Patient called needing prep for procedure tomorrow. Appears it was sent in earlier today. Patient had no further questions and thanked me for the call

## 2024-09-28 ENCOUNTER — Encounter: Admitting: Internal Medicine

## 2024-11-11 ENCOUNTER — Encounter: Admitting: Internal Medicine
